# Patient Record
Sex: Male | Born: 1955 | Race: Black or African American | Hispanic: No | Marital: Single | Smoking: Current every day smoker
Health system: Southern US, Community
[De-identification: ages and names within clinical notes are randomized; demographics above are authoritative.]

## PROBLEM LIST (undated history)

## (undated) DIAGNOSIS — M1990 Unspecified osteoarthritis, unspecified site: Secondary | ICD-10-CM

## (undated) DIAGNOSIS — E0590 Thyrotoxicosis, unspecified without thyrotoxic crisis or storm: Secondary | ICD-10-CM

## (undated) DIAGNOSIS — I10 Essential (primary) hypertension: Secondary | ICD-10-CM

## (undated) DIAGNOSIS — T7840XA Allergy, unspecified, initial encounter: Secondary | ICD-10-CM

## (undated) DIAGNOSIS — C801 Malignant (primary) neoplasm, unspecified: Secondary | ICD-10-CM

## (undated) DIAGNOSIS — T8859XA Other complications of anesthesia, initial encounter: Secondary | ICD-10-CM

## (undated) HISTORY — DX: Essential (primary) hypertension: I10

## (undated) HISTORY — PX: WISDOM TOOTH EXTRACTION: SHX21

## (undated) HISTORY — PX: MULTIPLE TOOTH EXTRACTIONS: SHX2053

## (undated) HISTORY — PX: CYST EXCISION: SHX5701

## (undated) HISTORY — DX: Allergy, unspecified, initial encounter: T78.40XA

## (undated) HISTORY — PX: COLONOSCOPY: SHX174

---

## 2013-04-19 ENCOUNTER — Encounter: Payer: Self-pay | Admitting: Internal Medicine

## 2013-04-19 ENCOUNTER — Ambulatory Visit: Payer: Self-pay | Attending: Internal Medicine | Admitting: Internal Medicine

## 2013-04-19 VITALS — BP 149/90 | HR 89 | Temp 98.6°F | Resp 16 | Ht 67.0 in | Wt 137.0 lb

## 2013-04-19 DIAGNOSIS — M25511 Pain in right shoulder: Secondary | ICD-10-CM

## 2013-04-19 LAB — CBC
Hemoglobin: 13.4 g/dL (ref 13.0–17.0)
MCH: 28.6 pg (ref 26.0–34.0)
RBC: 4.68 MIL/uL (ref 4.22–5.81)

## 2013-04-19 MED ORDER — CARVEDILOL 12.5 MG PO TABS: 12.5000 mg | ORAL_TABLET | Freq: Two times a day (BID) | ORAL | Status: DC

## 2013-04-19 MED ORDER — TRAMADOL HCL 50 MG PO TABS: 50.0000 mg | ORAL_TABLET | Freq: Three times a day (TID) | ORAL | Status: DC | PRN

## 2013-04-19 NOTE — Progress Notes (Unsigned)
Pt here to establish care. Hx HTN Ran out of meds yesterday Smoker x 30 yrs. C/o both shoulder pain

## 2013-04-19 NOTE — Progress Notes (Unsigned)
Patient ID: George Austin, male   DOB: [birth year], 57 y.o.   MRN: [id number]  CC:  HPI: 57 year old male is here to establish care. The patient has been on Coreg for hypertension. He can have his medications yesterday. Patient is unemployed and recently found a job and is in the process of getting his insurance. Today he describes no chest pain or shortness of breath upon of consultation the patient does have some irregular heartbeat an EKG is pending. He denies any cardiopulmonary symptoms however he is complaining of pain between shoulder blades He has no other medical problems orthopnea He smokes one pack in 3-4 days He smoked for 30 years  His systolic blood pressure is 149 today  Mother and brother have diabetes  Diet hypertension  No Known Allergies Past Medical History  Diagnosis Date  . Hypertension    No current outpatient prescriptions on file prior to visit.   No current facility-administered medications on file prior to visit.   Family History  Problem Relation Age of Onset  . Diabetes Mother   . Hypertension Father   . Diabetes Brother    History   Social History  . Marital Status: Single    Spouse Name: N/A    Number of Children: N/A  . Years of Education: N/A   Occupational History  . Not on file.   Social History Main Topics  . Smoking status: Current Every Day Smoker -- 0.50 packs/day for 30 years    Types: Cigarettes  . Smokeless tobacco: Not on file  . Alcohol Use: No  . Drug Use: No  . Sexual Activity: Not on file   Other Topics Concern  . Not on file   Social History Narrative  . No narrative on file    Review of Systems  Constitutional: Negative for fever, chills, diaphoresis, activity change, appetite change and fatigue.  HENT: Negative for ear pain, nosebleeds, congestion, facial swelling, rhinorrhea, neck pain, neck stiffness and ear discharge.   Eyes: Negative for pain, discharge, redness, itching and visual disturbance.   Respiratory: Negative for cough, choking, chest tightness, shortness of breath, wheezing and stridor.   Cardiovascular: Negative for chest pain, palpitations and leg swelling.  Gastrointestinal: Negative for abdominal distention.  Genitourinary: Negative for dysuria, urgency, frequency, hematuria, flank pain, decreased urine volume, difficulty urinating and dyspareunia.  Musculoskeletal: Negative for back pain, joint swelling, arthralgias and gait problem.  Neurological: Negative for dizziness, tremors, seizures, syncope, facial asymmetry, speech difficulty, weakness, light-headedness, numbness and headaches.  Hematological: Negative for adenopathy. Does not bruise/bleed easily.  Psychiatric/Behavioral: Negative for hallucinations, behavioral problems, confusion, dysphoric mood, decreased concentration and agitation.    Objective:   Filed Vitals:   04/19/13 1520  BP: 149/90  Pulse: 89  Temp: 98.6 F (37 C)  Resp: 16    Physical Exam  Constitutional: Appears well-developed and well-nourished. No distress.  HENT: Normocephalic. External right and left ear normal. Oropharynx is clear and moist.  Eyes: Conjunctivae and EOM are normal. PERRLA, no scleral icterus.  Neck: Normal ROM. Neck supple. No JVD. No tracheal deviation. No thyromegaly.  CVS: RRR, S1/S2 +, no murmurs, no gallops, no carotid bruit.  Pulmonary: Effort and breath sounds normal, no stridor, rhonchi, wheezes, rales.  Abdominal: Soft. BS +,  no distension, tenderness, rebound or guarding.  Musculoskeletal: Normal range of motion. No edema and no tenderness.  Lymphadenopathy: No lymphadenopathy noted, cervical, inguinal. Neuro: Alert. Normal reflexes, muscle tone coordination. No cranial nerve deficit. Skin: Skin is warm  and dry. No rash noted. Not diaphoretic. No erythema. No pallor.  Psychiatric: Normal mood and affect. Behavior, judgment, thought content normal.   No results found for this basename: WBC, HGB, HCT,  MCV, PLT   No results found for this basename: CREATININE, BUN, NA, K, CL, CO2    No results found for this basename: HGBA1C   Lipid Panel  No results found for this basename: chol, trig, hdl, cholhdl, vldl, ldlcalc       Assessment and plan:   There are no active problems to display for this patient.  Hypertension Recent Coreg Obtain EKG Given pain in his shoulder and between the shoulder blades, will get an EKG, troponin, d-dimer      The patient was given clear instructions to go to ER or return to medical center if symptoms don't improve, worsen or new problems develop. The patient verbalized understanding. The patient was told to call to get any lab results if not heard anything in the next week.

## 2013-04-20 LAB — COMPREHENSIVE METABOLIC PANEL
Albumin: 4.3 g/dL (ref 3.5–5.2)
CO2: 29 mEq/L (ref 19–32)
Chloride: 104 mEq/L (ref 96–112)
Glucose, Bld: 80 mg/dL (ref 70–99)
Potassium: 4.2 mEq/L (ref 3.5–5.3)
Sodium: 140 mEq/L (ref 135–145)
Total Protein: 7.3 g/dL (ref 6.0–8.3)

## 2013-04-20 LAB — TROPONIN I: Troponin I: 0.01 ng/mL (ref <0.06)

## 2013-04-22 ENCOUNTER — Ambulatory Visit (HOSPITAL_COMMUNITY)
Admission: RE | Admit: 2013-04-22 | Discharge: 2013-04-22 | Disposition: A | Discharge status: Home / Self Care | Payer: Self-pay | Source: Ambulatory Visit | Attending: Internal Medicine | Admitting: Internal Medicine

## 2013-04-22 DIAGNOSIS — M25511 Pain in right shoulder: Secondary | ICD-10-CM

## 2013-04-22 DIAGNOSIS — F17200 Nicotine dependence, unspecified, uncomplicated: Secondary | ICD-10-CM | POA: Insufficient documentation

## 2013-04-22 DIAGNOSIS — R9431 Abnormal electrocardiogram [ECG] [EKG]: Secondary | ICD-10-CM | POA: Insufficient documentation

## 2013-04-22 DIAGNOSIS — I10 Essential (primary) hypertension: Secondary | ICD-10-CM | POA: Insufficient documentation

## 2013-04-22 NOTE — Progress Notes (Signed)
Echocardiogram 2D Echocardiogram has been performed.  George Austin 04/22/2013, 10:30 AM

## 2013-04-27 ENCOUNTER — Ambulatory Visit: Payer: Self-pay

## 2013-04-27 LAB — D-DIMER, QUANTITATIVE

## 2013-05-03 ENCOUNTER — Encounter: Payer: Self-pay | Admitting: Internal Medicine

## 2013-05-03 ENCOUNTER — Ambulatory Visit: Payer: Self-pay | Attending: Family Medicine | Admitting: Internal Medicine

## 2013-05-03 VITALS — BP 149/85 | HR 75 | Temp 98.0°F | Resp 16

## 2013-05-03 DIAGNOSIS — R51 Headache: Secondary | ICD-10-CM | POA: Insufficient documentation

## 2013-05-03 DIAGNOSIS — M25512 Pain in left shoulder: Secondary | ICD-10-CM

## 2013-05-03 DIAGNOSIS — N179 Acute kidney failure, unspecified: Secondary | ICD-10-CM

## 2013-05-03 DIAGNOSIS — I10 Essential (primary) hypertension: Secondary | ICD-10-CM

## 2013-05-03 DIAGNOSIS — M25519 Pain in unspecified shoulder: Secondary | ICD-10-CM | POA: Insufficient documentation

## 2013-05-03 DIAGNOSIS — M25511 Pain in right shoulder: Secondary | ICD-10-CM

## 2013-05-03 DIAGNOSIS — Z09 Encounter for follow-up examination after completed treatment for conditions other than malignant neoplasm: Secondary | ICD-10-CM | POA: Insufficient documentation

## 2013-05-03 DIAGNOSIS — F17200 Nicotine dependence, unspecified, uncomplicated: Secondary | ICD-10-CM

## 2013-05-03 MED ORDER — ACETAMINOPHEN 500 MG PO TABS: 1000.0000 mg | ORAL_TABLET | Freq: Three times a day (TID) | ORAL | Status: DC | PRN

## 2013-05-03 MED ORDER — CARVEDILOL 12.5 MG PO TABS: 6.2500 mg | ORAL_TABLET | Freq: Two times a day (BID) | ORAL | Status: DC

## 2013-05-03 MED ORDER — AMLODIPINE BESYLATE 5 MG PO TABS: 5.0000 mg | ORAL_TABLET | Freq: Every day | ORAL | Status: DC

## 2013-05-03 MED ORDER — ACETAMINOPHEN 500 MG PO TABS: 500.0000 mg | ORAL_TABLET | Freq: Four times a day (QID) | ORAL | Status: DC | PRN

## 2013-05-03 NOTE — Progress Notes (Signed)
PT HERE FOR LAB RESULTS. C/O SLIGHT R SHOULDER PAIN. TAKES TRAMADOL PRN

## 2013-05-03 NOTE — Progress Notes (Signed)
Patient ID: George Austin, male   DOB: Jul 27, 1956, 57 y.o.   MRN: [id number] PCP:  Jeanann Lewandowsky, MD   Chief Complaint:  Follow up  HPI: 57 year old male who was recently seen in the clinic to establish care for hypertension and right shoulder pain here for followup. Patient reports that after being placed on coreg  12.5 mg twice daily he has experienced headache and he has reduced the dose to once a day and feels fine. He also reports ongoing pain over the right shoulder for past several weeks. He reports having immediately to the right shoulder about 2 years back. He has been taking Aleve 1 to 2 tablets daily for the pain. Recent blood work showed mild acute kidney injury  with creatinine 1.4. He also had a 2-D echo done for?? Which Was normal.  Allergies: No Known Allergies  Prior to Admission medications   Medication Sig Start Date End Date Taking? Authorizing Provider  carvedilol (COREG) 12.5 MG tablet Take 0.5 tablets (6.25 mg total) by mouth 2 (two) times daily with a meal. 05/03/13  Yes Shamal Stracener, MD  acetaminophen (TYLENOL) 500 MG tablet Take 2 tablets (1,000 mg total) by mouth every 8 (eight) hours as needed for pain. 05/03/13   Felder Lebeda, MD  amLODipine (NORVASC) 5 MG tablet Take 1 tablet (5 mg total) by mouth daily. 05/03/13   Eddie North, MD    Past Medical History  Diagnosis Date  . Hypertension     History reviewed. No pertinent past surgical history.  Social History:  reports that he has been smoking Cigarettes.  He has a 15 pack-year smoking history. He does not have any smokeless tobacco history on file. He reports that he does not drink alcohol or use illicit drugs.  Family History  Problem Relation Age of Onset  . Diabetes Mother   . Hypertension Father   . Diabetes Brother     Review of Systems:  As outlined in history of present illness  Physical Exam:  Filed Vitals:   05/03/13 1622  BP: 149/85  Pulse: 75  Temp: 98 F (36.7 C)   TempSrc: Oral  Resp: 16  SpO2: 98%    Constitutional: Vital signs reviewed.  Patient is a thin built male in no acute distress HEENT: No pallor, moist oral mucosa Chest: Clear to auscultation bilaterally, no added sounds CVS: Normal S1 and S2, no murmurs rub or gallop Abdomen: Soft, nontender, nondistended, bowel sounds present Extremities: Painful range of motion above 90 of right shoulder. Mild Tenderness to palpation over right anterior shoulder joint on movement. No deformity or swelling noted. In his: AAO x3  Labs on Admission:  No results found for this or any previous visit (from the past 48 hour(s)).  Radiological Exams on Admission: No results found.  Assessment/Plan Uncontrolled hypertension I will reduce his Coreg dose to 6.25 mg twice a day given symptoms of headache with higher dose. I will add low-dose amlodipine 5 mg daily. Patient instructed on a reducing salt intake in diet and regular exercise. Counseled on smoking cessation.  Right shoulder pain possible rotator cuff injury i will obtain an  x-ray of the right shoulder joint. Patient instructed to avoid NSAIDs. that this him on extra strength Tylenol when necessary.  Tobacco use Counseled on smoking cessation  Acute kidney injury Possibly in the setting of NSAIDs use. Instructed to avoid NSAIDs and followup in 2 weeks to have repeat BMET   Followup in 2 weeks for blood pressure monitoring  and repeat BMET.  Lexis Potenza 05/03/2013, 4:52 PM

## 2013-05-12 ENCOUNTER — Ambulatory Visit (HOSPITAL_COMMUNITY)
Admission: RE | Admit: 2013-05-12 | Discharge: 2013-05-12 | Disposition: A | Discharge status: Home / Self Care | Payer: Self-pay | Source: Ambulatory Visit | Attending: Internal Medicine | Admitting: Internal Medicine

## 2013-05-12 DIAGNOSIS — M25519 Pain in unspecified shoulder: Secondary | ICD-10-CM | POA: Insufficient documentation

## 2013-05-12 DIAGNOSIS — M25511 Pain in right shoulder: Secondary | ICD-10-CM

## 2013-05-12 NOTE — Addendum Note (Signed)
Addended by: Lestine Mount on: 05/12/2013 02:25 PM   Modules accepted: Orders

## 2013-05-13 ENCOUNTER — Ambulatory Visit: Payer: Self-pay | Attending: Family Medicine | Admitting: Internal Medicine

## 2013-05-13 VITALS — BP 135/84 | HR 76 | Temp 97.2°F | Resp 16

## 2013-05-13 DIAGNOSIS — E785 Hyperlipidemia, unspecified: Secondary | ICD-10-CM | POA: Insufficient documentation

## 2013-05-13 DIAGNOSIS — M25519 Pain in unspecified shoulder: Secondary | ICD-10-CM

## 2013-05-13 DIAGNOSIS — F17200 Nicotine dependence, unspecified, uncomplicated: Secondary | ICD-10-CM

## 2013-05-13 DIAGNOSIS — M25511 Pain in right shoulder: Secondary | ICD-10-CM

## 2013-05-13 DIAGNOSIS — I10 Essential (primary) hypertension: Secondary | ICD-10-CM

## 2013-05-13 DIAGNOSIS — N179 Acute kidney failure, unspecified: Secondary | ICD-10-CM

## 2013-05-13 MED ORDER — NICOTINE 14 MG/24HR TD PT24: 1.0000 | MEDICATED_PATCH | TRANSDERMAL | Status: DC

## 2013-05-13 MED ORDER — TRAMADOL HCL 50 MG PO TABS: 50.0000 mg | ORAL_TABLET | Freq: Three times a day (TID) | ORAL | Status: DC | PRN

## 2013-05-13 NOTE — Progress Notes (Signed)
Patient here for results from yesterday Had xray of shoulder done

## 2013-05-13 NOTE — Progress Notes (Signed)
Patient ID: George Austin, male   DOB: [birth year], 57 y.o.   MRN: [id number] Patient Demographics  George Austin, is a 57 y.o. male  [id number]  [id number]  DOB - 1955-10-05  Chief Complaint  Patient presents with  . Results        Subjective:   George Austin today is here for a follow up visit. Patient has No headache, No chest pain, No abdominal pain - No Nausea, No new weakness tingling or numbness, No Cough - SOB.  Is here for his x-ray results of the right shoulder  Objective:    Filed Vitals:   05/13/13 1411 05/13/13 1424  BP: 122/78 135/84  Pulse: 76   Temp: 97.2 F (36.2 C)   Resp: 16   SpO2: 100%      ALLERGIES:  No Known Allergies  PAST MEDICAL HISTORY: Past Medical History  Diagnosis Date  . Hypertension     MEDICATIONS AT HOME: Prior to Admission medications   Medication Sig Start Date End Date Taking? Authorizing Provider  acetaminophen (TYLENOL) 500 MG tablet Take 2 tablets (1,000 mg total) by mouth every 8 (eight) hours as needed for pain. 05/03/13   Nishant Dhungel, MD  amLODipine (NORVASC) 5 MG tablet Take 1 tablet (5 mg total) by mouth daily. 05/03/13   Nishant Dhungel, MD  carvedilol (COREG) 12.5 MG tablet Take 0.5 tablets (6.25 mg total) by mouth 2 (two) times daily with a meal. 05/03/13   Nishant Dhungel, MD  traMADol (ULTRAM) 50 MG tablet Take 1 tablet (50 mg total) by mouth every 8 (eight) hours as needed for pain. 05/13/13   Ripudeep Jenna Luo, MD     Exam  General appearance :Awake, alert, NAD, Speech Clear.  HEENT: Atraumatic and Normocephalic, PERLA Neck: supple, no JVD. No cervical lymphadenopathy.  Chest: Clear to auscultation bilaterally, no wheezing, rales or rhonchi CVS: S1 S2 regular, no murmurs.  Abdomen: soft, NBS, NT, ND, no gaurding, rigidity or rebound. Extremities: no cyanosis or clubbing, B/L Lower Ext shows no edema Neurology: Awake alert, and oriented X 3, CN II-XII intact, Non focal Skin: No Rash or  lesions Wounds:N/A    Data Review   Basic Metabolic Panel: No results found for this basename: NA, K, CL, CO2, GLUCOSE, BUN, CREATININE, CALCIUM, MG, PHOS,  in the last 168 hours Liver Function Tests: No results found for this basename: AST, ALT, ALKPHOS, BILITOT, PROT, ALBUMIN,  in the last 168 hours  CBC: No results found for this basename: WBC, NEUTROABS, HGB, HCT, MCV, PLT,  in the last 168 hours  ------------------------------------------------------------------------------------------------------------------ No results found for this basename: HGBA1C,  in the last 72 hours ------------------------------------------------------------------------------------------------------------------ No results found for this basename: CHOL, HDL, LDLCALC, TRIG, CHOLHDL, LDLDIRECT,  in the last 72 hours ------------------------------------------------------------------------------------------------------------------ No results found for this basename: TSH, T4TOTAL, FREET3, T3FREE, THYROIDAB,  in the last 72 hours ------------------------------------------------------------------------------------------------------------------ No results found for this basename: VITAMINB12, FOLATE, FERRITIN, TIBC, IRON, RETICCTPCT,  in the last 72 hours  Coagulation profile  No results found for this basename: INR, PROTIME,  in the last 168 hours    Assessment & Plan   Active Problems: Hypertension: BP stable - He has amlodipine and coreg with refills  Hyperlipidemia - Patient did not want any statins, wants to repeat labs at next visit.  - he will try healthy diet  Nicotine use: 1/2 pack in 2 days, motivated to quit smoking - will give prescription for nicotine patch  Right shoulder discomfort: - xray negative, will  give prescription for tramadol - does not want MRI to r/o rotator cuff tear  AKI: recheck BMET  Health screening: declined flu shot  Follow-up in 3 months    RAI,RIPUDEEP  M.D. 05/13/2013, 2:26 PM

## 2013-05-14 LAB — BASIC METABOLIC PANEL
CO2: 25 mEq/L (ref 19–32)
Chloride: 101 mEq/L (ref 96–112)
Glucose, Bld: 94 mg/dL (ref 70–99)
Potassium: 4.1 mEq/L (ref 3.5–5.3)
Sodium: 136 mEq/L (ref 135–145)

## 2013-08-12 ENCOUNTER — Ambulatory Visit: Payer: Self-pay | Attending: Internal Medicine | Admitting: Internal Medicine

## 2013-08-12 ENCOUNTER — Encounter: Payer: Self-pay | Admitting: Internal Medicine

## 2013-08-12 ENCOUNTER — Encounter (INDEPENDENT_AMBULATORY_CARE_PROVIDER_SITE_OTHER): Payer: Self-pay

## 2013-08-12 VITALS — BP 139/89 | HR 87 | Temp 98.9°F | Resp 14 | Ht 67.0 in | Wt 124.2 lb

## 2013-08-12 DIAGNOSIS — I10 Essential (primary) hypertension: Secondary | ICD-10-CM

## 2013-08-12 DIAGNOSIS — L851 Acquired keratosis [keratoderma] palmaris et plantaris: Secondary | ICD-10-CM | POA: Insufficient documentation

## 2013-08-12 LAB — CBC WITH DIFFERENTIAL/PLATELET
Basophils Absolute: 0 10*3/uL (ref 0.0–0.1)
Basophils Relative: 0 % (ref 0–1)
Eosinophils Relative: 3 % (ref 0–5)
HCT: 42.4 % (ref 39.0–52.0)
Hemoglobin: 14.9 g/dL (ref 13.0–17.0)
MCH: 30.8 pg (ref 26.0–34.0)
MCHC: 35.1 g/dL (ref 30.0–36.0)
MCV: 87.8 fL (ref 78.0–100.0)
Monocytes Absolute: 0.8 10*3/uL (ref 0.1–1.0)
Monocytes Relative: 10 % (ref 3–12)
RDW: 17.8 % — ABNORMAL HIGH (ref 11.5–15.5)

## 2013-08-12 LAB — COMPLETE METABOLIC PANEL WITH GFR
AST: 39 U/L — ABNORMAL HIGH (ref 0–37)
Albumin: 4.8 g/dL (ref 3.5–5.2)
Alkaline Phosphatase: 108 U/L (ref 39–117)
BUN: 13 mg/dL (ref 6–23)
Calcium: 9.8 mg/dL (ref 8.4–10.5)
Creat: 1.08 mg/dL (ref 0.50–1.35)
GFR, Est Non African American: 77 mL/min
Glucose, Bld: 84 mg/dL (ref 70–99)

## 2013-08-12 LAB — LIPID PANEL
Cholesterol: 219 mg/dL — ABNORMAL HIGH (ref 0–200)
Total CHOL/HDL Ratio: 2.4 Ratio
Triglycerides: 80 mg/dL (ref <150)
VLDL: 16 mg/dL (ref 0–40)

## 2013-08-12 MED ORDER — CARVEDILOL 12.5 MG PO TABS: 6.2500 mg | ORAL_TABLET | Freq: Two times a day (BID) | ORAL | Status: DC

## 2013-08-12 MED ORDER — TRAMADOL HCL 50 MG PO TABS: 50.0000 mg | ORAL_TABLET | Freq: Three times a day (TID) | ORAL | Status: DC | PRN

## 2013-08-12 MED ORDER — AMLODIPINE BESYLATE 5 MG PO TABS: 5.0000 mg | ORAL_TABLET | Freq: Every day | ORAL | Status: DC

## 2013-08-12 MED ORDER — ACETAMINOPHEN 500 MG PO TABS: 1000.0000 mg | ORAL_TABLET | Freq: Three times a day (TID) | ORAL | Status: DC | PRN

## 2013-08-12 MED ORDER — NICOTINE 14 MG/24HR TD PT24: 14.0000 mg | MEDICATED_PATCH | TRANSDERMAL | Status: DC

## 2013-08-12 NOTE — Progress Notes (Signed)
Pt is here for a check up.  Complains of spot at the bottom of Lt shoulder on the back x3 weeks. No pain. Abdominal pain that fluctuates; no nausea or vomiting.

## 2013-08-12 NOTE — Progress Notes (Signed)
Patient ID: George Austin, male   DOB: Apr 30, 1956, 57 y.o.   MRN: [id number]   CC:  HPI: 57 year old male here for a 3 monthly followup. He states that he has a mole below his left shoulder that he has noticed for about 3 weeks. The lesion is itchy. He does not not it is increasing in size. He has not used anything topically  He states that shoulder pain is much improved His blood pressures also controlled    No Known Allergies Past Medical History  Diagnosis Date  . Hypertension    No current outpatient prescriptions on file prior to visit.   No current facility-administered medications on file prior to visit.   Family History  Problem Relation Age of Onset  . Diabetes Mother   . Hypertension Father   . Diabetes Brother    History   Social History  . Marital Status: Single    Spouse Name: N/A    Number of Children: N/A  . Years of Education: N/A   Occupational History  . Not on file.   Social History Main Topics  . Smoking status: Current Every Day Smoker -- 0.50 packs/day for 30 years    Types: Cigarettes  . Smokeless tobacco: Not on file  . Alcohol Use: Yes  . Drug Use: No  . Sexual Activity: Not on file   Other Topics Concern  . Not on file   Social History Narrative  . No narrative on file    Review of Systems  Constitutional: Negative for fever, chills, diaphoresis, activity change, appetite change and fatigue.  HENT: Negative for ear pain, nosebleeds, congestion, facial swelling, rhinorrhea, neck pain, neck stiffness and ear discharge.   Eyes: Negative for pain, discharge, redness, itching and visual disturbance.  Respiratory: Negative for cough, choking, chest tightness, shortness of breath, wheezing and stridor.   Cardiovascular: Negative for chest pain, palpitations and leg swelling.  Gastrointestinal: Negative for abdominal distention.  Genitourinary: Negative for dysuria, urgency, frequency, hematuria, flank pain, decreased urine volume,  difficulty urinating and dyspareunia.  Musculoskeletal: Negative for back pain, joint swelling, arthralgias and gait problem.  Neurological: Negative for dizziness, tremors, seizures, syncope, facial asymmetry, speech difficulty, weakness, light-headedness, numbness and headaches.  Hematological: Negative for adenopathy. Does not bruise/bleed easily.  Psychiatric/Behavioral: Negative for hallucinations, behavioral problems, confusion, dysphoric mood, decreased concentration and agitation.    Objective:   Filed Vitals:   08/12/13 1428  BP: 139/89  Pulse: 87  Temp: 98.9 F (37.2 C)  Resp: 14    Physical Exam  Constitutional: Appears well-developed and well-nourished. No distress.  HENT: Normocephalic. External right and left ear normal. Oropharynx is clear and moist.  Eyes: Conjunctivae and EOM are normal. PERRLA, no scleral icterus.  Neck: Normal ROM. Neck supple. No JVD. No tracheal deviation. No thyromegaly.  CVS: RRR, S1/S2 +, no murmurs, no gallops, no carotid bruit.  Pulmonary: Effort and breath sounds normal, no stridor, rhonchi, wheezes, rales.  Abdominal: Soft. BS +,  no distension, tenderness, rebound or guarding.  Musculoskeletal: Normal range of motion. No edema and no tenderness.  Lymphadenopathy: No lymphadenopathy noted, cervical, inguinal. Neuro: Alert. Normal reflexes, muscle tone coordination. No cranial nerve deficit. Skin: 1 cm by half a centimeter lesion below the left shoulder and appears to be a hyperkeratotic skin lesion with surrounding erythema and hyperpigmentation  .  Psychiatric: Normal mood and affect. Behavior, judgment, thought content normal.   Lab Results  Component Value Date   WBC 8.4 04/19/2013  HGB 13.4 04/19/2013   HCT 40.3 04/19/2013   MCV 86.1 04/19/2013   PLT 333 04/19/2013   Lab Results  Component Value Date   CREATININE 1.16 05/13/2013   BUN 15 05/13/2013   NA 136 05/13/2013   K 4.1 05/13/2013   CL 101 05/13/2013   CO2 25 05/13/2013     No results found for this basename: HGBA1C   Lipid Panel     Component Value Date/Time   CHOL 215* 04/19/2013 1531   TRIG 136 04/19/2013 1531   HDL 42 04/19/2013 1531   CHOLHDL 5.1 04/19/2013 1531   VLDL 27 04/19/2013 1531   LDLCALC 146* 04/19/2013 1531       Assessment and plan:   Patient Active Problem List   Diagnosis Date Noted  . Unspecified essential hypertension 05/03/2013  . Acute kidney injury 05/03/2013  . Right shoulder pain 05/03/2013  . Tobacco use disorder 05/03/2013   Hyperkeratotic left shoulder lesion Will refer to dermatology for a skin biopsy Apply hydrocortisone ointment for itching  Right shoulder discomfort resolved     Hypertension Medications refilled, continue Coreg and Norvasc at the current dose  The patient was given clear instructions to go to ER or return to medical center if symptoms don't improve, worsen or new problems develop. The patient verbalized understanding. The patient was told to call to get any lab results if not heard anything in the next week.

## 2013-08-25 ENCOUNTER — Telehealth: Payer: Self-pay | Admitting: *Deleted

## 2013-08-25 NOTE — Telephone Encounter (Signed)
Message copied by Carmilla Granville, [place] R on Wed Aug 25, 2013 10:41 AM ------      Message from: Susie Cassette MD, [facility]      Created: Tue Aug 24, 2013  2:27 PM       Please notify patient and her labs are normal ------

## 2013-09-22 ENCOUNTER — Other Ambulatory Visit: Payer: Self-pay | Admitting: Internal Medicine

## 2013-11-10 ENCOUNTER — Ambulatory Visit: Payer: Self-pay | Admitting: Internal Medicine

## 2014-01-12 ENCOUNTER — Other Ambulatory Visit: Payer: Self-pay | Admitting: Internal Medicine

## 2014-01-12 DIAGNOSIS — I10 Essential (primary) hypertension: Secondary | ICD-10-CM

## 2014-01-14 ENCOUNTER — Other Ambulatory Visit: Payer: Self-pay | Admitting: Internal Medicine

## 2014-10-03 ENCOUNTER — Other Ambulatory Visit: Payer: Self-pay | Admitting: Internal Medicine

## 2014-10-04 ENCOUNTER — Other Ambulatory Visit: Payer: Self-pay | Admitting: Internal Medicine

## 2014-10-05 NOTE — Telephone Encounter (Signed)
Patient needs to schedule an appointment.  Unable to authorize additional refills.

## 2014-12-08 ENCOUNTER — Telehealth: Payer: Self-pay | Admitting: Internal Medicine

## 2014-12-09 NOTE — Telephone Encounter (Signed)
Patient called to request a med refill for amLODipine (NORVASC) 5 MG tablet, he is currently working on finding a new doctor since he now has insurance but needs enough medication to last him until then. Please f/u with pt.

## 2014-12-12 ENCOUNTER — Telehealth: Payer: Self-pay | Admitting: *Deleted

## 2014-12-12 NOTE — Telephone Encounter (Signed)
Patient has not been seen at clinic since 2014 and called in to say he is looking for a new MD but wants us to refill his medication in the meantime.  Explained to patient that we were unable to refill his medications

## 2014-12-14 NOTE — Telephone Encounter (Signed)
Patient came into clinic requesting medication refill for amLODipine (NORVASC) 5 MG tablet. Pt is scheduled for 12/21/14 , could we supply patient with 10 day supply. Please f/u

## 2014-12-21 ENCOUNTER — Encounter: Payer: Self-pay | Admitting: Internal Medicine

## 2014-12-21 ENCOUNTER — Ambulatory Visit: Payer: No Typology Code available for payment source | Attending: Internal Medicine | Admitting: Internal Medicine

## 2014-12-21 VITALS — BP 110/75 | HR 94 | Temp 98.4°F | Resp 16 | Ht 67.0 in | Wt 106.0 lb

## 2014-12-21 DIAGNOSIS — I10 Essential (primary) hypertension: Secondary | ICD-10-CM | POA: Insufficient documentation

## 2014-12-21 DIAGNOSIS — F17200 Nicotine dependence, unspecified, uncomplicated: Secondary | ICD-10-CM

## 2014-12-21 DIAGNOSIS — Z720 Tobacco use: Secondary | ICD-10-CM | POA: Insufficient documentation

## 2014-12-21 NOTE — Progress Notes (Signed)
Pt is here following up on his HTN. Pt states that he has been out of his medications for about 10 days.

## 2014-12-21 NOTE — Progress Notes (Signed)
Patient ID: George AsalGeorge E Papania, male   DOB: 10/11/1955, 59 y.o.   MRN: [id number] Subjective:  George Austin is a 59 y.o. male with hypertension.  He reports that he has been taking coreg and amlodipine for the past 1.5 years. He states that he has been out of amlodipine for the past 10 days. Today his pressures are normal.   Current Outpatient Prescriptions  Medication Sig Dispense Refill  . amLODipine (NORVASC) 5 MG tablet TAKE 1 TABLET BY MOUTH ONCE DAILY 30 tablet 0  . carvedilol (COREG) 12.5 MG tablet TAKE 1 TABLET BY MOUTH TWICE DAILY WITH A MEAL 60 tablet 3  . acetaminophen (TYLENOL) 500 MG tablet Take 2 tablets (1,000 mg total) by mouth every 8 (eight) hours as needed. (Patient not taking: Reported on 12/21/2014) 30 tablet 0  . nicotine (EQL NICOTINE) 14 mg/24hr patch Place 1 patch (14 mg total) onto the skin daily. (Patient not taking: Reported on 12/21/2014) 28 patch 2  . traMADol (ULTRAM) 50 MG tablet Take 1 tablet (50 mg total) by mouth every 8 (eight) hours as needed. (Patient not taking: Reported on 12/21/2014) 60 tablet 2   No current facility-administered medications for this visit.    Hypertension ROS: taking medications as instructed, no medication side effects noted, no TIA's, no chest pain on exertion, no dyspnea on exertion, no swelling of ankles and no palpitations.  New concerns: He would like to come off at least one of his medication. He smokes 1 pack every 3-4 days.   Objective:  BP 110/75 mmHg  Pulse 94  Temp(Src) 98.4 F (36.9 C) (Oral)  Resp 16  Ht 5\' 7"  (1.702 m)  Wt 106 lb (48.081 kg)  BMI 16.60 kg/m2  SpO2 98%  Appearance alert, well appearing, and in no distress and oriented to person, place, and time. General exam BP noted to be well controlled today in office, S1, S2 normal, no gallop, no murmur, chest clear, no JVD, no HSM, no edema, CVS exam  - normal rate, regular rhythm, normal S1, S2, no murmurs, rubs, clicks or gallops, normal bilateral carotid upstroke  without bruits, no JVD.  Lab review: orders written for new lab studies as appropriate; see orders.   Assessment:   Hypertension well controlled and needs to quit smoking.   Plan:  Orders and follow up as documented in patient record. Recommended sodium restriction. Very strongly urged to quit smoking to reduce cardiovascular risk. The following changes are to be made: May stop amlodipine for now until next BP check.  > 50% of time spent reviewing patients chart and getting medical history since this is my initial visit with patient  Return in about 4 weeks (around 01/18/2015) for Nurse Visit-BP check and Lab Visit and 6 mo PCP HTN. If his BP is still normal while off amlodipine he may continue with coreg BID only. If elevated at that time he will need to restart amlodipine   George Austin, VALERIE, NP 12/25/2014 9:38 PM

## 2014-12-21 NOTE — Patient Instructions (Signed)
Smoking Cessation Quitting smoking is important to your health and has many advantages. However, it is not always easy to quit since nicotine is a very addictive drug. Oftentimes, people try 3 times or more before being able to quit. This document explains the best ways for you to prepare to quit smoking. Quitting takes hard work and a lot of effort, but you can do it. ADVANTAGES OF QUITTING SMOKING  You will live longer, feel better, and live better.  Your body will feel the impact of quitting smoking almost immediately.  Within 20 minutes, blood pressure decreases. Your pulse returns to its normal level.  After 8 hours, carbon monoxide levels in the blood return to normal. Your oxygen level increases.  After 24 hours, the chance of having a heart attack starts to decrease. Your breath, hair, and body stop smelling like smoke.  After 48 hours, damaged nerve endings begin to recover. Your sense of taste and smell improve.  After 72 hours, the body is virtually free of nicotine. Your bronchial tubes relax and breathing becomes easier.  After 2 to 12 weeks, lungs can hold more air. Exercise becomes easier and circulation improves.  The risk of having a heart attack, stroke, cancer, or lung disease is greatly reduced.  After 1 year, the risk of coronary heart disease is cut in half.  After 5 years, the risk of stroke falls to the same as a nonsmoker.  After 10 years, the risk of lung cancer is cut in half and the risk of other cancers decreases significantly.  After 15 years, the risk of coronary heart disease drops, usually to the level of a nonsmoker.  If you are pregnant, quitting smoking will improve your chances of having a healthy baby.  The people you live with, especially any children, will be healthier.  You will have extra money to spend on things other than cigarettes. QUESTIONS TO THINK ABOUT BEFORE ATTEMPTING TO QUIT You may want to talk about your answers with your  health care provider.  Why do you want to quit?  If you tried to quit in the past, what helped and what did not?  What will be the most difficult situations for you after you quit? How will you plan to handle them?  Who can help you through the tough times? Your family? Friends? A health care provider?  What pleasures do you get from smoking? What ways can you still get pleasure if you quit? Here are some questions to ask your health care provider:  How can you help me to be successful at quitting?  What medicine do you think would be best for me and how should I take it?  What should I do if I need more help?  What is smoking withdrawal like? How can I get information on withdrawal? GET READY  Set a quit date.  Change your environment by getting rid of all cigarettes, ashtrays, matches, and lighters in your home, car, or work. Do not let people smoke in your home.  Review your past attempts to quit. Think about what worked and what did not. GET SUPPORT AND ENCOURAGEMENT You have a better chance of being successful if you have help. You can get support in many ways.  Tell your family, friends, and coworkers that you are going to quit and need their support. Ask them not to smoke around you.  Get individual, group, or telephone counseling and support. Programs are available at local hospitals and health centers. Call   your local health department for information about programs in your area.  Spiritual beliefs and practices may help some smokers quit.  Download a "quit meter" on your computer to keep track of quit statistics, such as how long you have gone without smoking, cigarettes not smoked, and money saved.  Get a self-help book about quitting smoking and staying off tobacco. LEARN NEW SKILLS AND BEHAVIORS  Distract yourself from urges to smoke. Talk to someone, go for a walk, or occupy your time with a task.  Change your normal routine. Take a different route to work.  Drink tea instead of coffee. Eat breakfast in a different place.  Reduce your stress. Take a hot bath, exercise, or read a book.  Plan something enjoyable to do every day. Reward yourself for not smoking.  Explore interactive web-based programs that specialize in helping you quit. GET MEDICINE AND USE IT CORRECTLY Medicines can help you stop smoking and decrease the urge to smoke. Combining medicine with the above behavioral methods and support can greatly increase your chances of successfully quitting smoking.  Nicotine replacement therapy helps deliver nicotine to your body without the negative effects and risks of smoking. Nicotine replacement therapy includes nicotine gum, lozenges, inhalers, nasal sprays, and skin patches. Some may be available over-the-counter and others require a prescription.  Antidepressant medicine helps people abstain from smoking, but how this works is unknown. This medicine is available by prescription.  Nicotinic receptor partial agonist medicine simulates the effect of nicotine in your brain. This medicine is available by prescription. Ask your health care provider for advice about which medicines to use and how to use them based on your health history. Your health care provider will tell you what side effects to look out for if you choose to be on a medicine or therapy. Carefully read the information on the package. Do not use any other product containing nicotine while using a nicotine replacement product.  RELAPSE OR DIFFICULT SITUATIONS Most relapses occur within the first 3 months after quitting. Do not be discouraged if you start smoking again. Remember, most people try several times before finally quitting. You may have symptoms of withdrawal because your body is used to nicotine. You may crave cigarettes, be irritable, feel very hungry, cough often, get headaches, or have difficulty concentrating. The withdrawal symptoms are only temporary. They are strongest  when you first quit, but they will go away within 10-14 days. To reduce the chances of relapse, try to:  Avoid drinking alcohol. Drinking lowers your chances of successfully quitting.  Reduce the amount of caffeine you consume. Once you quit smoking, the amount of caffeine in your body increases and can give you symptoms, such as a rapid heartbeat, sweating, and anxiety.  Avoid smokers because they can make you want to smoke.  Do not let weight gain distract you. Many smokers will gain weight when they quit, usually less than 10 pounds. Eat a healthy diet and stay active. You can always lose the weight gained after you quit.  Find ways to improve your mood other than smoking. FOR MORE INFORMATION  www.smokefree.gov  Document Released: 08/06/2001 Document Revised: 12/27/2013 Document Reviewed: 11/21/2011 [employer] Patient Information 2015 [employer]. This information is not intended to replace advice given to you by your health care provider. Make sure you discuss any questions you have with your health care provider. DASH Eating Plan DASH stands for "Dietary Approaches to Stop Hypertension." The DASH eating plan is a healthy eating plan that has   been shown to reduce high blood pressure (hypertension). Additional health benefits may include reducing the risk of type 2 diabetes mellitus, heart disease, and stroke. The DASH eating plan may also help with weight loss. WHAT DO I NEED TO KNOW ABOUT THE DASH EATING PLAN? For the DASH eating plan, you will follow these general guidelines:  Choose foods with a percent daily value for sodium of less than 5% (as listed on the food label).  Use salt-free seasonings or herbs instead of table salt or sea salt.  Check with your health care provider or pharmacist before using salt substitutes.  Eat lower-sodium products, often labeled as "lower sodium" or "no salt added."  Eat fresh foods.  Eat more vegetables, fruits, and low-fat dairy  products.  Choose whole grains. Look for the word "whole" as the first word in the ingredient list.  Choose fish and skinless chicken or turkey more often than red meat. Limit fish, poultry, and meat to 6 oz (170 g) each day.  Limit sweets, desserts, sugars, and sugary drinks.  Choose heart-healthy fats.  Limit cheese to 1 oz (28 g) per day.  Eat more home-cooked food and less restaurant, buffet, and fast food.  Limit fried foods.  Cook foods using methods other than frying.  Limit canned vegetables. If you do use them, rinse them well to decrease the sodium.  When eating at a restaurant, ask that your food be prepared with less salt, or no salt if possible. WHAT FOODS CAN I EAT? Seek help from a dietitian for individual calorie needs. Grains Whole grain or whole wheat bread. Brown rice. Whole grain or whole wheat pasta. Quinoa, bulgur, and whole grain cereals. Low-sodium cereals. Corn or whole wheat flour tortillas. Whole grain cornbread. Whole grain crackers. Low-sodium crackers. Vegetables Fresh or frozen vegetables (raw, steamed, roasted, or grilled). Low-sodium or reduced-sodium tomato and vegetable juices. Low-sodium or reduced-sodium tomato sauce and paste. Low-sodium or reduced-sodium canned vegetables.  Fruits All fresh, canned (in natural juice), or frozen fruits. Meat and Other Protein Products Ground beef (85% or leaner), grass-fed beef, or beef trimmed of fat. Skinless chicken or turkey. Ground chicken or turkey. Pork trimmed of fat. All fish and seafood. Eggs. Dried beans, peas, or lentils. Unsalted nuts and seeds. Unsalted canned beans. Dairy Low-fat dairy products, such as skim or 1% milk, 2% or reduced-fat cheeses, low-fat ricotta or cottage cheese, or plain low-fat yogurt. Low-sodium or reduced-sodium cheeses. Fats and Oils Tub margarines without trans fats. Light or reduced-fat mayonnaise and salad dressings (reduced sodium). Avocado. Safflower, olive, or canola  oils. Natural peanut or almond butter. Other Unsalted popcorn and pretzels. The items listed above may not be a complete list of recommended foods or beverages. Contact your dietitian for more options. WHAT FOODS ARE NOT RECOMMENDED? Grains White bread. White pasta. White rice. Refined cornbread. Bagels and croissants. Crackers that contain trans fat. Vegetables Creamed or fried vegetables. Vegetables in a cheese sauce. Regular canned vegetables. Regular canned tomato sauce and paste. Regular tomato and vegetable juices. Fruits Dried fruits. Canned fruit in light or heavy syrup. Fruit juice. Meat and Other Protein Products Fatty cuts of meat. Ribs, chicken wings, bacon, sausage, bologna, salami, chitterlings, fatback, hot dogs, bratwurst, and packaged luncheon meats. Salted nuts and seeds. Canned beans with salt. Dairy Whole or 2% milk, cream, half-and-half, and cream cheese. Whole-fat or sweetened yogurt. Full-fat cheeses or blue cheese. Nondairy creamers and whipped toppings. Processed cheese, cheese spreads, or cheese curds. Condiments Onion and garlic salt,   seasoned salt, table salt, and sea salt. Canned and packaged gravies. Worcestershire sauce. Tartar sauce. Barbecue sauce. Teriyaki sauce. Soy sauce, including reduced sodium. Steak sauce. Fish sauce. Oyster sauce. Cocktail sauce. Horseradish. Ketchup and mustard. Meat flavorings and tenderizers. Bouillon cubes. Hot sauce. Tabasco sauce. Marinades. Taco seasonings. Relishes. Fats and Oils Butter, stick margarine, lard, shortening, ghee, and bacon fat. Coconut, palm kernel, or palm oils. Regular salad dressings. Other Pickles and olives. Salted popcorn and pretzels. The items listed above may not be a complete list of foods and beverages to avoid. Contact your dietitian for more information. WHERE CAN I FIND MORE INFORMATION? [employer], [employer]: www.nhlbi.nih.gov/health/health-topics/topics/dash/ Document Released:  08/01/2011 Document Revised: 12/27/2013 Document Reviewed: 06/16/2013 [employer] Patient Information 2015 [employer]. This information is not intended to replace advice given to you by your health care provider. Make sure you discuss any questions you have with your health care provider.  

## 2014-12-25 MED ORDER — CARVEDILOL 12.5 MG PO TABS: ORAL_TABLET | ORAL | Status: DC

## 2015-01-18 ENCOUNTER — Ambulatory Visit: Payer: No Typology Code available for payment source | Attending: Internal Medicine | Admitting: *Deleted

## 2015-01-18 ENCOUNTER — Ambulatory Visit: Payer: No Typology Code available for payment source

## 2015-01-18 VITALS — BP 145/81 | HR 93 | Temp 98.7°F | Resp 16

## 2015-01-18 DIAGNOSIS — F17200 Nicotine dependence, unspecified, uncomplicated: Secondary | ICD-10-CM | POA: Insufficient documentation

## 2015-01-18 DIAGNOSIS — I10 Essential (primary) hypertension: Secondary | ICD-10-CM | POA: Insufficient documentation

## 2015-01-18 LAB — COMPLETE METABOLIC PANEL WITH GFR
ALBUMIN: 4.1 g/dL (ref 3.5–5.2)
ALT: 17 U/L (ref 0–53)
AST: 50 U/L — AB (ref 0–37)
Alkaline Phosphatase: 87 U/L (ref 39–117)
BILIRUBIN TOTAL: 0.4 mg/dL (ref 0.2–1.2)
BUN: 17 mg/dL (ref 6–23)
CALCIUM: 9.3 mg/dL (ref 8.4–10.5)
CHLORIDE: 103 meq/L (ref 96–112)
CO2: 26 meq/L (ref 19–32)
CREATININE: 1.16 mg/dL (ref 0.50–1.35)
GFR, EST NON AFRICAN AMERICAN: 70 mL/min
GFR, Est African American: 81 mL/min
GLUCOSE: 89 mg/dL (ref 70–99)
Potassium: 5 mEq/L (ref 3.5–5.3)
SODIUM: 142 meq/L (ref 135–145)
Total Protein: 7.1 g/dL (ref 6.0–8.3)

## 2015-01-18 LAB — LIPID PANEL
Cholesterol: 199 mg/dL (ref 0–200)
HDL: 91 mg/dL (ref >=40)
LDL CALC: 88 mg/dL (ref 0–99)
Total CHOL/HDL Ratio: 2.2 Ratio
Triglycerides: 98 mg/dL (ref <150)
VLDL: 20 mg/dL (ref 0–40)

## 2015-01-18 LAB — CBC
HCT: 40.5 % (ref 39.0–52.0)
HEMOGLOBIN: 14.3 g/dL (ref 13.0–17.0)
MCH: 33.1 pg (ref 26.0–34.0)
MCHC: 35.3 g/dL (ref 30.0–36.0)
MCV: 93.8 fL (ref 78.0–100.0)
MPV: 9.5 fL (ref 8.6–12.4)
PLATELETS: 279 10*3/uL (ref 150–400)
RBC: 4.32 MIL/uL (ref 4.22–5.81)
RDW: 15.9 % — ABNORMAL HIGH (ref 11.5–15.5)
WBC: 6.3 10*3/uL (ref 4.0–10.5)

## 2015-01-18 NOTE — Patient Instructions (Signed)
Smoking Cessation, Tips for Success If you are ready to quit smoking, congratulations! You have chosen to help yourself be healthier. Cigarettes bring nicotine, tar, carbon monoxide, and other irritants into your body. Your lungs, heart, and blood vessels will be able to work better without these poisons. There are many different ways to quit smoking. Nicotine gum, nicotine patches, a nicotine inhaler, or nicotine nasal spray can help with physical craving. Hypnosis, support groups, and medicines help break the habit of smoking. WHAT THINGS CAN I DO TO MAKE QUITTING EASIER?  Here are some tips to help you quit for good:  Pick a date when you will quit smoking completely. Tell all of your friends and family about your plan to quit on that date.  Do not try to slowly cut down on the number of cigarettes you are smoking. Pick a quit date and quit smoking completely starting on that day.  Throw away all cigarettes.   Clean and remove all ashtrays from your home, work, and car.  On a card, write down your reasons for quitting. Carry the card with you and read it when you get the urge to smoke.  Cleanse your body of nicotine. Drink enough water and fluids to keep your urine clear or pale yellow. Do this after quitting to flush the nicotine from your body.  Learn to predict your moods. Do not let a bad situation be your excuse to have a cigarette. Some situations in your life might tempt you into wanting a cigarette.  Never have "just one" cigarette. It leads to wanting another and another. Remind yourself of your decision to quit.  Change habits associated with smoking. If you smoked while driving or when feeling stressed, try other activities to replace smoking. Stand up when drinking your coffee. Brush your teeth after eating. Sit in a different chair when you read the paper. Avoid alcohol while trying to quit, and try to drink fewer caffeinated beverages. Alcohol and caffeine may urge you to  smoke.  Avoid foods and drinks that can trigger a desire to smoke, such as sugary or spicy foods and alcohol.  Ask people who smoke not to smoke around you.  Have something planned to do right after eating or having a cup of coffee. For example, plan to take a walk or exercise.  Try a relaxation exercise to calm you down and decrease your stress. Remember, you may be tense and nervous for the first 2 weeks after you quit, but this will pass.  Find new activities to keep your hands busy. Play with a pen, coin, or rubber band. Doodle or draw things on paper.  Brush your teeth right after eating. This will help cut down on the craving for the taste of tobacco after meals. You can also try mouthwash.   Use oral substitutes in place of cigarettes. Try using lemon drops, carrots, cinnamon sticks, or chewing gum. Keep them handy so they are available when you have the urge to smoke.  When you have the urge to smoke, try deep breathing.  Designate your home as a nonsmoking area.  If you are a heavy smoker, ask your health care provider about a prescription for nicotine chewing gum. It can ease your withdrawal from nicotine.  Reward yourself. Set aside the cigarette money you save and buy yourself something nice.  Look for support from others. Join a support group or smoking cessation program. Ask someone at home or at work to help you with your plan   to quit smoking.  Always ask yourself, "Do I need this cigarette or is this just a reflex?" Tell yourself, "Today, I choose not to smoke," or "I do not want to smoke." You are reminding yourself of your decision to quit.  Do not replace cigarette smoking with electronic cigarettes (commonly called e-cigarettes). The safety of e-cigarettes is unknown, and some may contain harmful chemicals.  If you relapse, do not give up! Plan ahead and think about what you will do the next time you get the urge to smoke. HOW WILL I FEEL WHEN I QUIT SMOKING? You  may have symptoms of withdrawal because your body is used to nicotine (the addictive substance in cigarettes). You may crave cigarettes, be irritable, feel very hungry, cough often, get headaches, or have difficulty concentrating. The withdrawal symptoms are only temporary. They are strongest when you first quit but will go away within 10-14 days. When withdrawal symptoms occur, stay in control. Think about your reasons for quitting. Remind yourself that these are signs that your body is healing and getting used to being without cigarettes. Remember that withdrawal symptoms are easier to treat than the major diseases that smoking can cause.  Even after the withdrawal is over, expect periodic urges to smoke. However, these cravings are generally short lived and will go away whether you smoke or not. Do not smoke! WHAT RESOURCES ARE AVAILABLE TO HELP ME QUIT SMOKING? Your health care provider can direct you to community resources or hospitals for support, which may include:  Group support.  Education.  Hypnosis.  Therapy. Document Released: 05/10/2004 Document Revised: 12/27/2013 Document Reviewed: 01/28/2013 ExitCare Patient Information 2015 [employer]. This information is not intended to replace advice given to you by your health care provider. Make sure you discuss any questions you have with your health care provider. Smoking Cessation Quitting smoking is important to your health and has many advantages. However, it is not always easy to quit since nicotine is a very addictive drug. Oftentimes, people try 3 times or more before being able to quit. This document explains the best ways for you to prepare to quit smoking. Quitting takes hard work and a lot of effort, but you can do it. ADVANTAGES OF QUITTING SMOKING  You will live longer, feel better, and live better.  Your body will feel the impact of quitting smoking almost immediately.  Within 20 minutes, blood pressure decreases. Your  pulse returns to its normal level.  After 8 hours, carbon monoxide levels in the blood return to normal. Your oxygen level increases.  After 24 hours, the chance of having a heart attack starts to decrease. Your breath, hair, and body stop smelling like smoke.  After 48 hours, damaged nerve endings begin to recover. Your sense of taste and smell improve.  After 72 hours, the body is virtually free of nicotine. Your bronchial tubes relax and breathing becomes easier.  After 2 to 12 weeks, lungs can hold more air. Exercise becomes easier and circulation improves.  The risk of having a heart attack, stroke, cancer, or lung disease is greatly reduced.  After 1 year, the risk of coronary heart disease is cut in half.  After 5 years, the risk of stroke falls to the same as a nonsmoker.  After 10 years, the risk of lung cancer is cut in half and the risk of other cancers decreases significantly.  After 15 years, the risk of coronary heart disease drops, usually to the level of a nonsmoker.  If   you are pregnant, quitting smoking will improve your chances of having a healthy baby.  The people you live with, especially any children, will be healthier.  You will have extra money to spend on things other than cigarettes. QUESTIONS TO THINK ABOUT BEFORE ATTEMPTING TO QUIT You may want to talk about your answers with your health care provider.  Why do you want to quit?  If you tried to quit in the past, what helped and what did not?  What will be the most difficult situations for you after you quit? How will you plan to handle them?  Who can help you through the tough times? Your family? Friends? A health care provider?  What pleasures do you get from smoking? What ways can you still get pleasure if you quit? Here are some questions to ask your health care provider:  How can you help me to be successful at quitting?  What medicine do you think would be best for me and how should I take  it?  What should I do if I need more help?  What is smoking withdrawal like? How can I get information on withdrawal? GET READY  Set a quit date.  Change your environment by getting rid of all cigarettes, ashtrays, matches, and lighters in your home, car, or work. Do not let people smoke in your home.  Review your past attempts to quit. Think about what worked and what did not. GET SUPPORT AND ENCOURAGEMENT You have a better chance of being successful if you have help. You can get support in many ways.  Tell your family, friends, and coworkers that you are going to quit and need their support. Ask them not to smoke around you.  Get individual, group, or telephone counseling and support. Programs are available at local hospitals and health centers. Call your local health department for information about programs in your area.  Spiritual beliefs and practices may help some smokers quit.  Download a "quit meter" on your computer to keep track of quit statistics, such as how long you have gone without smoking, cigarettes not smoked, and money saved.  Get a self-help book about quitting smoking and staying off tobacco. LEARN NEW SKILLS AND BEHAVIORS  Distract yourself from urges to smoke. Talk to someone, go for a walk, or occupy your time with a task.  Change your normal routine. Take a different route to work. Drink tea instead of coffee. Eat breakfast in a different place.  Reduce your stress. Take a hot bath, exercise, or read a book.  Plan something enjoyable to do every day. Reward yourself for not smoking.  Explore interactive web-based programs that specialize in helping you quit. GET MEDICINE AND USE IT CORRECTLY Medicines can help you stop smoking and decrease the urge to smoke. Combining medicine with the above behavioral methods and support can greatly increase your chances of successfully quitting smoking.  Nicotine replacement therapy helps deliver nicotine to your body  without the negative effects and risks of smoking. Nicotine replacement therapy includes nicotine gum, lozenges, inhalers, nasal sprays, and skin patches. Some may be available over-the-counter and others require a prescription.  Antidepressant medicine helps people abstain from smoking, but how this works is unknown. This medicine is available by prescription.  Nicotinic receptor partial agonist medicine simulates the effect of nicotine in your brain. This medicine is available by prescription. Ask your health care provider for advice about which medicines to use and how to use them based on your health   history. Your health care provider will tell you what side effects to look out for if you choose to be on a medicine or therapy. Carefully read the information on the package. Do not use any other product containing nicotine while using a nicotine replacement product.  RELAPSE OR DIFFICULT SITUATIONS Most relapses occur within the first 3 months after quitting. Do not be discouraged if you start smoking again. Remember, most people try several times before finally quitting. You may have symptoms of withdrawal because your body is used to nicotine. You may crave cigarettes, be irritable, feel very hungry, cough often, get headaches, or have difficulty concentrating. The withdrawal symptoms are only temporary. They are strongest when you first quit, but they will go away within 10-14 days. To reduce the chances of relapse, try to:  Avoid drinking alcohol. Drinking lowers your chances of successfully quitting.  Reduce the amount of caffeine you consume. Once you quit smoking, the amount of caffeine in your body increases and can give you symptoms, such as a rapid heartbeat, sweating, and anxiety.  Avoid smokers because they can make you want to smoke.  Do not let weight gain distract you. Many smokers will gain weight when they quit, usually less than 10 pounds. Eat a healthy diet and stay active. You  can always lose the weight gained after you quit.  Find ways to improve your mood other than smoking. FOR MORE INFORMATION  www.smokefree.gov  Document Released: 08/06/2001 Document Revised: 12/27/2013 Document Reviewed: 11/21/2011 ExitCare Patient Information 2015 [employer]. This information is not intended to replace advice given to you by your health care provider. Make sure you discuss any questions you have with your health care provider. DASH Eating Plan DASH stands for "Dietary Approaches to Stop Hypertension." The DASH eating plan is a healthy eating plan that has been shown to reduce high blood pressure (hypertension). Additional health benefits may include reducing the risk of type 2 diabetes mellitus, heart disease, and stroke. The DASH eating plan may also help with weight loss. WHAT DO I NEED TO KNOW ABOUT THE DASH EATING PLAN? For the DASH eating plan, you will follow these general guidelines:  Choose foods with a percent daily value for sodium of less than 5% (as listed on the food label).  Use salt-free seasonings or herbs instead of table salt or sea salt.  Check with your health care provider or pharmacist before using salt substitutes.  Eat lower-sodium products, often labeled as "lower sodium" or "no salt added."  Eat fresh foods.  Eat more vegetables, fruits, and low-fat dairy products.  Choose whole grains. Look for the word "whole" as the first word in the ingredient list.  Choose fish and skinless chicken or turkey more often than red meat. Limit fish, poultry, and meat to 6 oz (170 g) each day.  Limit sweets, desserts, sugars, and sugary drinks.  Choose heart-healthy fats.  Limit cheese to 1 oz (28 g) per day.  Eat more home-cooked food and less restaurant, buffet, and fast food.  Limit fried foods.  Cook foods using methods other than frying.  Limit canned vegetables. If you do use them, rinse them well to decrease the sodium.  When eating at  a restaurant, ask that your food be prepared with less salt, or no salt if possible. WHAT FOODS CAN I EAT? Seek help from a dietitian for individual calorie needs. Grains Whole grain or whole wheat bread. Brown rice. Whole grain or whole wheat pasta. Quinoa, bulgur, and   whole grain cereals. Low-sodium cereals. Corn or whole wheat flour tortillas. Whole grain cornbread. Whole grain crackers. Low-sodium crackers. Vegetables Fresh or frozen vegetables (raw, steamed, roasted, or grilled). Low-sodium or reduced-sodium tomato and vegetable juices. Low-sodium or reduced-sodium tomato sauce and paste. Low-sodium or reduced-sodium canned vegetables.  Fruits All fresh, canned (in natural juice), or frozen fruits. Meat and Other Protein Products Ground beef (85% or leaner), grass-fed beef, or beef trimmed of fat. Skinless chicken or turkey. Ground chicken or turkey. Pork trimmed of fat. All fish and seafood. Eggs. Dried beans, peas, or lentils. Unsalted nuts and seeds. Unsalted canned beans. Dairy Low-fat dairy products, such as skim or 1% milk, 2% or reduced-fat cheeses, low-fat ricotta or cottage cheese, or plain low-fat yogurt. Low-sodium or reduced-sodium cheeses. Fats and Oils Tub margarines without trans fats. Light or reduced-fat mayonnaise and salad dressings (reduced sodium). Avocado. Safflower, olive, or canola oils. Natural peanut or almond butter. Other Unsalted popcorn and pretzels. The items listed above may not be a complete list of recommended foods or beverages. Contact your dietitian for more options. WHAT FOODS ARE NOT RECOMMENDED? Grains White bread. White pasta. White rice. Refined cornbread. Bagels and croissants. Crackers that contain trans fat. Vegetables Creamed or fried vegetables. Vegetables in a cheese sauce. Regular canned vegetables. Regular canned tomato sauce and paste. Regular tomato and vegetable juices. Fruits Dried fruits. Canned fruit in light or heavy syrup. Fruit  juice. Meat and Other Protein Products Fatty cuts of meat. Ribs, chicken wings, bacon, sausage, bologna, salami, chitterlings, fatback, hot dogs, bratwurst, and packaged luncheon meats. Salted nuts and seeds. Canned beans with salt. Dairy Whole or 2% milk, cream, half-and-half, and cream cheese. Whole-fat or sweetened yogurt. Full-fat cheeses or blue cheese. Nondairy creamers and whipped toppings. Processed cheese, cheese spreads, or cheese curds. Condiments Onion and garlic salt, seasoned salt, table salt, and sea salt. Canned and packaged gravies. Worcestershire sauce. Tartar sauce. Barbecue sauce. Teriyaki sauce. Soy sauce, including reduced sodium. Steak sauce. Fish sauce. Oyster sauce. Cocktail sauce. Horseradish. Ketchup and mustard. Meat flavorings and tenderizers. Bouillon cubes. Hot sauce. Tabasco sauce. Marinades. Taco seasonings. Relishes. Fats and Oils Butter, stick margarine, lard, shortening, ghee, and bacon fat. Coconut, palm kernel, or palm oils. Regular salad dressings. Other Pickles and olives. Salted popcorn and pretzels. The items listed above may not be a complete list of foods and beverages to avoid. Contact your dietitian for more information. WHERE CAN I FIND MORE INFORMATION? National Heart, Lung, and [employer] Institute: www.nhlbi.nih.gov/health/health-topics/topics/dash/ Document Released: 08/01/2011 Document Revised: 12/27/2013 Document Reviewed: 06/16/2013 ExitCare Patient Information 2015 [employer]. This information is not intended to replace advice given to you by your health care provider. Make sure you discuss any questions you have with your health care provider.  

## 2015-01-18 NOTE — Progress Notes (Signed)
Patient presents for fasting lab and BP check. Patient had been instructed at last OV to stop amlodipine and restart coreg 12.5 mg bid; however,  patient states not taking any meds at present; wanted to see what BP would be off all meds. Discussed need for low sodium diet and using Mrs. Dash as alternative to salt Encouraged to choose foods with 5% or less of daily value for sodium. Walking 6-10 hours per day for work 5 days per week Patient denies headaches, blurred vision, SHOB, chest pain or pressure Smoking  0.25-0.33 ppd; not trying to quit at present Discussed importance of smoking cessation to reduce risk of heart attack and stroke Patient given 1-800-Quit-Now  Filed Vitals:   01/18/15 0934  BP: 145/81  Pulse: 93  Temp: 98.7 F (37.1 C)  Resp: 16    Patient will restart coreg 12.5 mg bid and return in 2 weeks for nurse visit for BP check  Patient advised to call for med refills at least 7 days before running out so as not to go without.  Patient given literature on DASH Eating Plan, Smoking Cessation and Smoking Cessation Tips for Success

## 2015-01-19 ENCOUNTER — Other Ambulatory Visit: Payer: No Typology Code available for payment source

## 2015-01-19 LAB — PSA: PSA: 2.16 ng/mL (ref <=4.00)

## 2015-01-19 LAB — VITAMIN D 25 HYDROXY (VIT D DEFICIENCY, FRACTURES): VIT D 25 HYDROXY: 26 ng/mL — AB (ref 30–100)

## 2015-01-25 ENCOUNTER — Telehealth: Payer: Self-pay | Admitting: *Deleted

## 2015-01-25 NOTE — Telephone Encounter (Signed)
-----   Message from Ambrose [place]Valerie A Keck, NP sent at 01/23/2015  1:00 PM EDT ----- Cholesterol looks great continue medication.  Vitamin B-12 slightly low, may get over-the-counter vitamin D supplement 800 international units to take daily

## 2015-01-25 NOTE — Telephone Encounter (Signed)
Left HIPAA compliant message for patient to return my call. 

## 2015-02-02 ENCOUNTER — Telehealth: Payer: Self-pay | Admitting: Internal Medicine

## 2015-02-02 NOTE — Telephone Encounter (Signed)
Patient came into facility to speak to nurse about his results,please f/u with pt.

## 2015-02-10 NOTE — Telephone Encounter (Signed)
Patient f/u on results, please f/u with pt asap.

## 2015-02-14 NOTE — Telephone Encounter (Signed)
Nurse called patient, patient verified date of birth. Patient aware of cholesterol looking great and continue mediation.  Patient aware of vitamin B-12 slightly low and agrees to get over the counter vitamin D supplement 800 international units to take daily.  Patient confirmed appointment with nurse tomorrow at 4:30pm for BP check.

## 2015-02-15 ENCOUNTER — Ambulatory Visit: Payer: No Typology Code available for payment source | Attending: Internal Medicine | Admitting: *Deleted

## 2015-02-15 VITALS — BP 107/67 | HR 92 | Temp 98.6°F | Resp 16 | Ht 67.5 in | Wt 105.8 lb

## 2015-02-15 DIAGNOSIS — I10 Essential (primary) hypertension: Secondary | ICD-10-CM | POA: Insufficient documentation

## 2015-02-15 MED ORDER — AZELASTINE HCL 0.1 % NA SOLN: 2.0000 | Freq: Every day | NASAL | Status: DC

## 2015-02-15 MED ORDER — CARVEDILOL 12.5 MG PO TABS: 12.5000 mg | ORAL_TABLET | Freq: Every day | ORAL | Status: DC

## 2015-02-15 NOTE — Progress Notes (Signed)
Patient presents for BP check after restarting coreg 12.5 mg, however, patient states taking only once daily due to feeling nervous for first 30-60 minutes after taking. States this occurs 3-4 days per week. Patient still adding salt to food. Discussed need for low sodium diet and using Mrs. Dash as alternative to salt.  States walking all day at work Patient denies headaches, blurred vision, SHOB, chest pain  C/o clear nasal drainage since last evening. Denies nasal congestion Requesting azelastine nasal spray that he has used in past with relief  Filed Vitals:   02/15/15 1135  BP: 107/67  Pulse: 92  Temp: 98.6 F (37 C)  Resp: 16    Per PCP: Change coreg 12.5 mg on med list to reflect how pt taking Azelastine 2 puffs each nostril; once daily. (E-scribed to [facility] on [facility])  Patient advised to call for med refills at least 7 days before running out so as not to go without.  Patient aware that he is to f/u with PCP 3 months from last visit (Due 04/22/15)

## 2017-05-27 ENCOUNTER — Encounter: Payer: Self-pay | Admitting: Family Medicine

## 2017-05-27 ENCOUNTER — Ambulatory Visit (INDEPENDENT_AMBULATORY_CARE_PROVIDER_SITE_OTHER): Payer: Self-pay | Admitting: Family Medicine

## 2017-05-27 VITALS — BP 144/70 | HR 64 | Temp 98.7°F | Resp 16 | Ht 67.5 in | Wt 107.6 lb

## 2017-05-27 DIAGNOSIS — I10 Essential (primary) hypertension: Secondary | ICD-10-CM

## 2017-05-27 DIAGNOSIS — Z7689 Persons encountering health services in other specified circumstances: Secondary | ICD-10-CM

## 2017-05-27 LAB — POCT URINALYSIS DIP (DEVICE)
BILIRUBIN URINE: NEGATIVE
Glucose, UA: NEGATIVE mg/dL
HGB URINE DIPSTICK: NEGATIVE
KETONES UR: NEGATIVE mg/dL
Leukocytes, UA: NEGATIVE
Nitrite: NEGATIVE
PROTEIN: 100 mg/dL — AB
SPECIFIC GRAVITY, URINE: 1.025 (ref 1.005–1.030)
Urobilinogen, UA: 0.2 mg/dL (ref 0.0–1.0)
pH: 6 (ref 5.0–8.0)

## 2017-05-27 MED ORDER — CARVEDILOL 12.5 MG PO TABS: 12.5000 mg | ORAL_TABLET | Freq: Every day | ORAL | 3 refills | Status: DC

## 2017-05-27 MED FILL — CARVEDILOL 12.5 MG TABLET: 12.5 | 30 days supply | Qty: 30 | Fill #0

## 2017-05-27 NOTE — Patient Instructions (Signed)

## 2017-05-27 NOTE — Progress Notes (Signed)
Patient ID: George Austin, male    DOB: 1956/01/31, 61 y.o.   MRN: [id number]  PCP: Bing Neighbors, FNP  Chief Complaint  Patient presents with  . Establish Care    Subjective:  HPI George Austin is a 61 y.o. male presents to establish care. Medical problems significant for hypertension and current tobacco smoker. George Austin reports no home monitoring of blood pressure and reports adherence to blood pressure medications, although hasn't taken today as he out of medication. He is non-adherent to low sodium diet. Denies any episodes of dizziness,headaches, shortness of breath, or chest pain. George Austin reports no engagement in routine physical activity. Current Body mass index is 16.6 kg/m.  He reports no other complaints or concerns today. Social History   Social History  . Marital status: Single    Spouse name: N/A  . Number of children: N/A  . Years of education: N/A   Occupational History  . Not on file.   Social History Main Topics  . Smoking status: Current Every Day Smoker    Packs/day: 0.50    Years: 30.00    Types: Cigarettes  . Smokeless tobacco: Never Used     Comment: smoking .25 - 0.33 ppd  . Alcohol use 0.0 oz/week  . Drug use: No  . Sexual activity: Not on file   Other Topics Concern  . Not on file   Social History Narrative  . No narrative on file    Family History  Problem Relation Age of Onset  . Diabetes Mother   . Hypertension Father   . Diabetes Brother    Review of Systems See HPI  Patient Active Problem List   Diagnosis Date Noted  . HTN (hypertension) 05/03/2013  . Acute kidney injury (HCC) 05/03/2013  . Right shoulder pain 05/03/2013  . Tobacco use disorder 05/03/2013    No Known Allergies  Prior to Admission medications   Medication Sig Start Date End Date Taking? Authorizing Provider  carvedilol (COREG) 12.5 MG tablet Take 1 tablet (12.5 mg total) by mouth daily. 02/15/15  Yes Ambrose Finland, NP  acetaminophen (TYLENOL) 500 MG  tablet Take 2 tablets (1,000 mg total) by mouth every 8 (eight) hours as needed. Patient not taking: Reported on 12/21/2014 08/12/13   Richarda Overlie, MD  azelastine (ASTELIN) 0.1 % nasal spray Place 2 sprays into both nostrils daily. Use in each nostril as directed Patient not taking: Reported on 05/27/2017 02/15/15   Ambrose Finland, NP  nicotine (EQL NICOTINE) 14 mg/24hr patch Place 1 patch (14 mg total) onto the skin daily. Patient not taking: Reported on 12/21/2014 08/12/13   Richarda Overlie, MD  traMADol (ULTRAM) 50 MG tablet Take 1 tablet (50 mg total) by mouth every 8 (eight) hours as needed. Patient not taking: Reported on 12/21/2014 08/12/13   Richarda Overlie, MD    Past Medical, Surgical Family and Social History reviewed and updated.    Objective:   Today's Vitals   05/27/17 0820  BP: (!) 144/70  Pulse: 64  Resp: 16  Temp: 98.7 F (37.1 C)  TempSrc: Oral  SpO2: 99%  Weight: 107 lb 9.6 oz (48.8 kg)  Height: 5' 7.5" (1.715 m)    Wt Readings from Last 3 Encounters:  05/27/17 107 lb 9.6 oz (48.8 kg)  02/15/15 105 lb 12.8 oz (48 kg)  12/21/14 106 lb (48.1 kg)   Physical Exam  Constitutional: He is oriented to person, place, and time. He appears cachectic.  HENT:  Head: Normocephalic and atraumatic.  Eyes: Pupils are equal, round, and reactive to light. Conjunctivae and EOM are normal. No scleral icterus.  Neck: Normal range of motion. Neck supple.  Cardiovascular: Normal rate, regular rhythm, normal heart sounds and intact distal pulses.   Pulmonary/Chest: Effort normal and breath sounds normal.  Abdominal: Soft. Bowel sounds are normal.  Thinned abdomen   Musculoskeletal: Normal range of motion.  Neurological: He is alert and oriented to person, place, and time.  Skin: Skin is warm and dry.  Psychiatric: He has a normal mood and affect. His behavior is normal. Judgment and thought content normal.   Assessment & Plan:  1. Encounter to establish care 2. Essential  hypertension, uncontrolled today. Resumed carvedilol 12.5 mg daily. At follow-up will recheck BP, if not at goal will add amlodipine 2.5. Pulse rate is low normal, therefore avoid increasing carvedilol.   RTC: 1 month for hypertension recheck and fasting labs, PSA, Hep C screen, with A1C check.    George Austin. Tiburcio Pea, MSN, FNP-C [facility]  [address] [place], [place] [address] [phone]

## 2017-06-10 ENCOUNTER — Other Ambulatory Visit: Payer: Self-pay | Admitting: Family Medicine

## 2017-06-10 ENCOUNTER — Ambulatory Visit: Payer: Self-pay | Admitting: Family Medicine

## 2017-06-10 VITALS — BP 130/92

## 2017-06-10 DIAGNOSIS — Z0130 Encounter for examination of blood pressure without abnormal findings: Secondary | ICD-10-CM

## 2017-06-10 MED ORDER — AMLODIPINE BESYLATE 5 MG PO TABS: 5.0000 mg | ORAL_TABLET | Freq: Every day | ORAL | 3 refills | Status: DC

## 2017-06-10 MED FILL — AMLODIPINE BESYLATE 5 MG TA: 5 | 30 days supply | Qty: 30 | Fill #0

## 2017-06-10 NOTE — Progress Notes (Signed)
Blood pressure , diastolic remains elevated. Adding amlodipine  5 mg once daily to medication regimen.

## 2017-06-10 NOTE — Progress Notes (Signed)
Diastolic still remains rather high adding amlodipine 5 mg once daily for blood pressure control. Patient to return in 4 weeks for hypertension follow-up.  Godfrey Pick. Tiburcio Pea, MSN, FNP-C [facility]  [address] [place], [place] [address] [phone]

## 2017-07-08 ENCOUNTER — Ambulatory Visit (INDEPENDENT_AMBULATORY_CARE_PROVIDER_SITE_OTHER): Payer: No Typology Code available for payment source | Admitting: Family Medicine

## 2017-07-08 ENCOUNTER — Encounter: Payer: Self-pay | Admitting: Family Medicine

## 2017-07-08 VITALS — BP 142/96 | HR 85 | Ht 67.5 in | Wt 107.6 lb

## 2017-07-08 DIAGNOSIS — Z125 Encounter for screening for malignant neoplasm of prostate: Secondary | ICD-10-CM

## 2017-07-08 DIAGNOSIS — Z1159 Encounter for screening for other viral diseases: Secondary | ICD-10-CM

## 2017-07-08 DIAGNOSIS — E559 Vitamin D deficiency, unspecified: Secondary | ICD-10-CM

## 2017-07-08 DIAGNOSIS — I10 Essential (primary) hypertension: Secondary | ICD-10-CM

## 2017-07-08 DIAGNOSIS — Z0000 Encounter for general adult medical examination without abnormal findings: Secondary | ICD-10-CM

## 2017-07-08 DIAGNOSIS — Z131 Encounter for screening for diabetes mellitus: Secondary | ICD-10-CM

## 2017-07-08 DIAGNOSIS — Z1329 Encounter for screening for other suspected endocrine disorder: Secondary | ICD-10-CM

## 2017-07-08 DIAGNOSIS — Z114 Encounter for screening for human immunodeficiency virus [HIV]: Secondary | ICD-10-CM

## 2017-07-08 NOTE — Patient Instructions (Signed)

## 2017-07-08 NOTE — Progress Notes (Signed)
George Austin Blizzard, is a 61 y.o. male  NWG:[id number]  [id number]  DOB - 05/03/1956  CC:  Chief Complaint  Patient presents with  . Annual Exam      HPI: George Austin Barretto is a 61 y.o. male is here today for a complete annual physical. Medical history significant for current everyday smoker, thyroid problems?, and hypertension. George Austin reports over 5 years ago he was treated by endocrinologist for a thyroid disorder, although he is uncertain of what condition he was treated for. He denies treatment for a thyroid nodule or mass.  Reports condition resolved and his medication was discontinued. George Austin was recently started on amlodipine to help with management of blood pressure. He reports since starting medication, he has experienced dizziness if he takes medication with Carvedilol. Reports a family history (sibilibgs) significant for heart problems although is uncertain of which conditions siblings have been treated for in the past. Social History   Socioeconomic History  . Marital status: Single    Spouse name: Not on file  . Number of children: Not on file  . Years of education: Not on file  . Highest education level: Not on file  Social Needs  . Financial resource strain: Not on file  . Food insecurity - worry: Not on file  . Food insecurity - inability: Not on file  . Transportation needs - medical: Not on file  . Transportation needs - non-medical: Not on file  Occupational History  . Not on file  Tobacco Use  . Smoking status: Current Every Day Smoker    Packs/day: 0.50    Years: 30.00    Pack years: 15.00    Types: Cigarettes  . Smokeless tobacco: Never Used  . Tobacco comment: smoking .25 - 0.33 ppd  Substance and Sexual Activity  . Alcohol use: Yes    Alcohol/week: 0.0 oz  . Drug use: No  . Sexual activity: Not on file  Other Topics Concern  . Not on file  Social History Narrative  . Not on file     No Known Allergies Past Medical History:  Diagnosis Date  .  Hypertension    Current Outpatient Medications on File Prior to Visit  Medication Sig Dispense Refill  . acetaminophen (TYLENOL) 500 MG tablet Take 2 tablets (1,000 mg total) by mouth every 8 (eight) hours as needed. (Patient not taking: Reported on 12/21/2014) 30 tablet 0  . amLODipine (NORVASC) 5 MG tablet Take 1 tablet (5 mg total) by mouth daily. 90 tablet 3  . azelastine (ASTELIN) 0.1 % nasal spray Place 2 sprays into both nostrils daily. Use in each nostril as directed (Patient not taking: Reported on 05/27/2017) 30 mL 3  . carvedilol (COREG) 12.5 MG tablet Take 1 tablet (12.5 mg total) by mouth daily. 30 tablet 3  . nicotine (EQL NICOTINE) 14 mg/24hr patch Place 1 patch (14 mg total) onto the skin daily. (Patient not taking: Reported on 12/21/2014) 28 patch 2  . traMADol (ULTRAM) 50 MG tablet Take 1 tablet (50 mg total) by mouth every 8 (eight) hours as needed. (Patient not taking: Reported on 12/21/2014) 60 tablet 2   No current facility-administered medications on file prior to visit.    Family History  Problem Relation Age of Onset  . Diabetes Mother   . Hypertension Father   . Diabetes Brother    Social History   Socioeconomic History  . Marital status: Single    Spouse name: Not on file  . Number of children: Not on file  .  Years of education: Not on file  . Highest education level: Not on file  Social Needs  . Financial resource strain: Not on file  . Food insecurity - worry: Not on file  . Food insecurity - inability: Not on file  . Transportation needs - medical: Not on file  . Transportation needs - non-medical: Not on file  Occupational History  . Not on file  Tobacco Use  . Smoking status: Current Every Day Smoker    Packs/day: 0.50    Years: 30.00    Pack years: 15.00    Types: Cigarettes  . Smokeless tobacco: Never Used  . Tobacco comment: smoking .25 - 0.33 ppd  Substance and Sexual Activity  . Alcohol use: Yes    Alcohol/week: 0.0 oz  . Drug use: No  .  Sexual activity: Not on file  Other Topics Concern  . Not on file  Social History Narrative  . Not on file    Review of Systems: Constitutional: Negative for fever, chills, diaphoresis, activity change, appetite change and fatigue. HENT: Negative for ear pain, nosebleeds, congestion, facial swelling, rhinorrhea, neck pain, neck stiffness and ear discharge.  Eyes: Negative for pain, discharge, redness, itching and visual disturbance. Respiratory: Negative for cough, choking, chest tightness, shortness of breath, wheezing and stridor.  Cardiovascular: Negative for chest pain, palpitations and leg swelling. EKG significant NSR, LV hypertrophy Gastrointestinal: Negative for abdominal distention. Genitourinary: Negative for dysuria, urgency, frequency, hematuria, flank pain, decreased urine volume, difficulty urinating. Musculoskeletal: Negative for back pain, joint swelling, arthralgia and gait problem. Neurological: Negative for dizziness, tremors, seizures, syncope, facial asymmetry, speech difficulty, weakness, light-headedness, numbness and headaches.  Hematological: Negative for adenopathy. Does not bruise/bleed easily. Psychiatric/Behavioral: Negative for hallucinations, behavioral problems, confusion, dysphoric mood, decreased concentration and agitation.    Objective:   Vitals:   07/08/17 0812  BP: (!) 142/96  Pulse: 85  SpO2: 100%    Physical Exam: Constitutional: Patient appears well-developed and well-nourished. No distress. HENT: Normocephalic, atraumatic, External right and left ear normal. Oropharynx is clear and moist.  Eyes: Conjunctivae and EOM are normal. PERRLA, no scleral icterus. Neck: Normal ROM. Neck supple. No JVD. No tracheal deviation. No thyromegaly. CVS: RRR, S1/S2 +, no murmurs, no gallops, no carotid bruit.  Pulmonary: Effort and breath sounds normal, no stridor, rhonchi, wheezes, rales.  Abdominal: Soft. BS +, no distension, tenderness, rebound or  guarding.  Musculoskeletal: Normal range of motion. No edema and no tenderness.  Lymphadenopathy: No lymphadenopathy noted, cervical, inguinal or axillary Neuro: Alert. Normal reflexes, muscle tone coordination. No cranial nerve deficit. Skin: Skin is warm and dry. No rash noted. Not diaphoretic. No erythema. No pallor. Psychiatric: Normal mood and affect. Behavior, judgment, thought content normal.  Lab Results  Component Value Date   WBC 6.3 01/18/2015   HGB 14.3 01/18/2015   HCT 40.5 01/18/2015   MCV 93.8 01/18/2015   PLT 279 01/18/2015   Lab Results  Component Value Date   CREATININE 1.16 01/18/2015   BUN 17 01/18/2015   NA 142 01/18/2015   K 5.0 01/18/2015   CL 103 01/18/2015   CO2 26 01/18/2015    Lab Results  Component Value Date   HGBA1C 5.4 08/12/2013   Lipid Panel     Component Value Date/Time   CHOL 199 01/18/2015 0918   TRIG 98 01/18/2015 0918   HDL 91 01/18/2015 0918   CHOLHDL 2.2 01/18/2015 0918   VLDL 20 01/18/2015 0918   LDLCALC 88 01/18/2015 [id number]  Assessment and plan:  1. Essential hypertension, elevated today. Patient has not taken medication today due to fasting for labs. Complains of dizziness with amlodipine. Recommended avoiding taking both antihypertensive medications at the same time. Amlodipine may be better tolerated at bedtime. Goal BP 140/90. Encouraged smoking cessation.  2. Screening for diabetes mellitus, checking A1C today.   3. Vitamin D deficiency, hx of deficiency, currently not prescribed replacement.   4. Need for hepatitis C screening test-Hepatitis c vrs RNA detect by PCR-qual  5. Screening PSA (prostate specific antigen)- PSA  6. Annual Physical Exam -age appropriate anticipatory guidance provided.   Orders Placed This Encounter  Procedures  . Hepatitis c vrs RNA detect by PCR-qual  . PSA  . CBC with Differential  . COMPLETE METABOLIC PANEL WITH GFR  . Hemoglobin A1c  . Vitamin D, 25-hydroxy  . Thyroid Panel  With TSH  . Lipid panel  . HIV antibody (with reflex)  . EKG 12-Lead    Return in about 6 months (around 01/05/2018), or hypertension .  The patient was given clear instructions to go to ER or return to medical center if symptoms don't improve, worsen or new problems develop. The patient verbalized understanding. The patient was told to call to get lab results if they haven't heard anything in the next week.     This note has been created with Education officer, environmental. Any transcriptional errors are unintentional.

## 2017-07-09 LAB — THYROID PANEL WITH TSH
Free Thyroxine Index: 2.3 (ref 1.4–3.8)
T3 UPTAKE: 34 % (ref 22–35)
T4 TOTAL: 6.7 ug/dL (ref 4.9–10.5)
TSH: 0.73 m[IU]/L (ref 0.40–4.50)

## 2017-07-09 LAB — LIPID PANEL
CHOLESTEROL: 221 mg/dL — AB (ref <200)
HDL: 90 mg/dL (ref >40)
LDL Cholesterol (Calc): 112 mg/dL (calc) — ABNORMAL HIGH
Non-HDL Cholesterol (Calc): 131 mg/dL (calc) — ABNORMAL HIGH (ref <130)
Total CHOL/HDL Ratio: 2.5 (calc) (ref <5.0)
Triglycerides: 88 mg/dL (ref <150)

## 2017-07-09 LAB — HIV ANTIBODY (ROUTINE TESTING W REFLEX): HIV 1&2 Ab, 4th Generation: NONREACTIVE

## 2017-07-12 ENCOUNTER — Telehealth: Payer: Self-pay | Admitting: Family Medicine

## 2017-07-12 DIAGNOSIS — Z79899 Other long term (current) drug therapy: Secondary | ICD-10-CM

## 2017-07-12 LAB — COMPLETE METABOLIC PANEL WITH GFR
AG RATIO: 1.5 (calc) (ref 1.0–2.5)
ALBUMIN MSPROF: 4.4 g/dL (ref 3.6–5.1)
ALKALINE PHOSPHATASE (APISO): 69 U/L (ref 40–115)
ALT: 17 U/L (ref 9–46)
AST: 40 U/L — ABNORMAL HIGH (ref 10–35)
BUN / CREAT RATIO: 15 (calc) (ref 6–22)
BUN: 20 mg/dL (ref 7–25)
CALCIUM: 9.5 mg/dL (ref 8.6–10.3)
CO2: 26 mmol/L (ref 20–32)
CREATININE: 1.36 mg/dL — AB (ref 0.70–1.33)
Chloride: 97 mmol/L — ABNORMAL LOW (ref 98–110)
GFR, EST NON AFRICAN AMERICAN: 57 mL/min/{1.73_m2} — AB (ref >=60)
GFR, Est African American: 66 mL/min/{1.73_m2} (ref >=60)
GLOBULIN: 3 g/dL (ref 1.9–3.7)
Glucose, Bld: 118 mg/dL — ABNORMAL HIGH (ref 65–99)
POTASSIUM: 5 mmol/L (ref 3.5–5.3)
SODIUM: 136 mmol/L (ref 135–146)
Total Bilirubin: 0.6 mg/dL (ref 0.2–1.2)
Total Protein: 7.4 g/dL (ref 6.1–8.1)

## 2017-07-12 LAB — HEMOGLOBIN A1C
Hgb A1c MFr Bld: 5.3 % of total Hgb (ref <5.7)
Mean Plasma Glucose: 105 (calc)
eAG (mmol/L): 5.8 (calc)

## 2017-07-12 LAB — CBC WITH DIFFERENTIAL/PLATELET
BASOS ABS: 40 {cells}/uL (ref 0–200)
Basophils Relative: 0.6 %
EOS PCT: 3.3 %
Eosinophils Absolute: 218 cells/uL (ref 15–500)
HEMATOCRIT: 39.3 % (ref 38.5–50.0)
Hemoglobin: 13.8 g/dL (ref 13.2–17.1)
LYMPHS ABS: 2244 {cells}/uL (ref 850–3900)
MCH: 33.4 pg — ABNORMAL HIGH (ref 27.0–33.0)
MCHC: 35.1 g/dL (ref 32.0–36.0)
MCV: 95.2 fL (ref 80.0–100.0)
MPV: 10.1 fL (ref 7.5–12.5)
Monocytes Relative: 11.7 %
NEUTROS ABS: 3326 {cells}/uL (ref 1500–7800)
NEUTROS PCT: 50.4 %
Platelets: 228 10*3/uL (ref 140–400)
RBC: 4.13 10*6/uL — ABNORMAL LOW (ref 4.20–5.80)
RDW: 13.3 % (ref 11.0–15.0)
Total Lymphocyte: 34 %
WBC: 6.6 10*3/uL (ref 3.8–10.8)
WBCMIX: 772 {cells}/uL (ref 200–950)

## 2017-07-12 LAB — HEPATITIS C VRS RNA DETECT BY PCR-QUAL: Hepatitis C Vrs RNA by PCR-Qual: NOT DETECTED

## 2017-07-12 LAB — PSA: PSA: 2.3 ng/mL (ref <=4.0)

## 2017-07-12 LAB — VITAMIN D 25 HYDROXY (VIT D DEFICIENCY, FRACTURES): Vit D, 25-Hydroxy: 18 ng/mL — ABNORMAL LOW (ref 30–100)

## 2017-07-12 MED ORDER — PRAVASTATIN SODIUM 40 MG PO TABS: 40.0000 mg | ORAL_TABLET | Freq: Every day | ORAL | 3 refills | Status: DC

## 2017-07-12 MED ORDER — VITAMIN D (ERGOCALCIFEROL) 1.25 MG (50000 UNIT) PO CAPS: 50000.0000 [IU] | ORAL_CAPSULE | ORAL | 2 refills | Status: DC

## 2017-07-12 NOTE — Telephone Encounter (Signed)
Contact patient to advise his cholesterol panel is abnormal elevated and his vitamin D is low. I am starting him on pravastatin 40 mg once daily at 6 pm and vitamin D replacement 50,000 units once weekly x 12 weeks.  He needs to return in 6 weeks to re-check liver enzymes.  Godfrey PickKimberly S. Tiburcio PeaHarris, MSN, FNP-C [facility]  [address] [place], [place] [address] [phone]

## 2017-07-12 NOTE — Telephone Encounter (Signed)
Order pravastatin 40 and ergocalciferol 50,000 units weekly

## 2017-07-14 NOTE — Telephone Encounter (Signed)
Tried to contact patient no answer and no vm setup to leave a message.

## 2017-07-16 MED FILL — ?CARVEDILOL 12.5 MG TABLET: 12.5 | 30 days supply | Qty: 30 | Fill #1

## 2017-07-21 MED FILL — AMLODIPINE BESYLATE 5 MG TA: 5 | 30 days supply | Qty: 30 | Fill #1

## 2017-09-11 NOTE — Telephone Encounter (Signed)
Tried to contact patient no answer and detailed message left on his vm.

## 2017-09-15 MED FILL — ?CARVEDILOL 12.5 MG TABLET: 12.5 | 30 days supply | Qty: 30 | Fill #2

## 2017-09-16 ENCOUNTER — Other Ambulatory Visit: Payer: Self-pay

## 2017-09-16 MED ORDER — VITAMIN D (ERGOCALCIFEROL) 1.25 MG (50000 UNIT) PO CAPS: 50000.0000 [IU] | ORAL_CAPSULE | ORAL | 2 refills | Status: DC

## 2017-09-16 MED ORDER — PRAVASTATIN SODIUM 40 MG PO TABS: 40.0000 mg | ORAL_TABLET | Freq: Every day | ORAL | 3 refills | Status: DC

## 2017-09-16 MED FILL — ?PRAVASTATIN SODIUM 40MG TA: 40 | 30 days supply | Qty: 30 | Fill #0

## 2017-09-16 MED FILL — VIT D2 1.25 MG (50,000 UNIT: 1.25 MG | 28 days supply | Qty: 4 | Fill #0

## 2017-09-16 NOTE — Telephone Encounter (Signed)
Patient called back and stated that pharmacy didn't have his medication yet. Patient was advise that medication will be resent to [facility]. Patient states that he is not in a rush to pick up medications because he has never been on them before. Patient was advise to follow providers care regarding medications

## 2017-11-21 MED FILL — PRAVASTATIN NA 40 MG TAB: 40 | 30 days supply | Qty: 30 | Fill #1

## 2017-11-21 MED FILL — CARVEDILOL 12.5 MG TABLET: 12.5 | 30 days supply | Qty: 30 | Fill #3

## 2017-11-24 MED FILL — AMLODIPINE BESYLATE 5 MG TA: 5 | 30 days supply | Qty: 30 | Fill #2

## 2018-01-05 ENCOUNTER — Ambulatory Visit: Payer: Self-pay | Admitting: Family Medicine

## 2018-01-14 ENCOUNTER — Encounter: Payer: Self-pay | Admitting: Family Medicine

## 2018-01-14 ENCOUNTER — Ambulatory Visit (INDEPENDENT_AMBULATORY_CARE_PROVIDER_SITE_OTHER): Payer: Self-pay | Admitting: Family Medicine

## 2018-01-14 VITALS — BP 132/74 | HR 90 | Temp 98.0°F | Ht 67.5 in | Wt 108.0 lb

## 2018-01-14 DIAGNOSIS — IMO0001 Reserved for inherently not codable concepts without codable children: Secondary | ICD-10-CM

## 2018-01-14 DIAGNOSIS — N529 Male erectile dysfunction, unspecified: Secondary | ICD-10-CM

## 2018-01-14 DIAGNOSIS — Z09 Encounter for follow-up examination after completed treatment for conditions other than malignant neoplasm: Secondary | ICD-10-CM

## 2018-01-14 DIAGNOSIS — I10 Essential (primary) hypertension: Secondary | ICD-10-CM

## 2018-01-14 DIAGNOSIS — E559 Vitamin D deficiency, unspecified: Secondary | ICD-10-CM

## 2018-01-14 DIAGNOSIS — Z716 Tobacco abuse counseling: Secondary | ICD-10-CM

## 2018-01-14 DIAGNOSIS — F17200 Nicotine dependence, unspecified, uncomplicated: Secondary | ICD-10-CM

## 2018-01-14 DIAGNOSIS — E785 Hyperlipidemia, unspecified: Secondary | ICD-10-CM

## 2018-01-14 LAB — POCT URINALYSIS DIP (MANUAL ENTRY)
Bilirubin, UA: NEGATIVE
Blood, UA: NEGATIVE
Glucose, UA: NEGATIVE mg/dL
Leukocytes, UA: NEGATIVE
Nitrite, UA: NEGATIVE
Protein Ur, POC: 100 mg/dL — AB
Spec Grav, UA: 1.02 (ref 1.010–1.025)
Urobilinogen, UA: 0.2 E.U./dL
pH, UA: 5.5 (ref 5.0–8.0)

## 2018-01-14 MED ORDER — SILDENAFIL CITRATE 25 MG PO TABS: 25.0000 mg | ORAL_TABLET | Freq: Every day | ORAL | 0 refills | Status: DC | PRN

## 2018-01-14 MED ORDER — CARVEDILOL 12.5 MG PO TABS: 12.5000 mg | ORAL_TABLET | Freq: Every day | ORAL | 3 refills | Status: DC

## 2018-01-14 MED FILL — !VIAGRA 25 MG TABLET: 25 | 30 days supply | Qty: 10 | Fill #0

## 2018-01-14 MED FILL — CARVEDILOL 12.5 MG TABLET: 12.5 | 30 days supply | Qty: 30 | Fill #0

## 2018-01-14 NOTE — Patient Instructions (Signed)
Sildenafil tablets (Viagra) What is this medicine? SILDENAFIL (sil DEN a fil) is used to treat erection problems in men. This medicine may be used for other purposes; ask your health care provider or pharmacist if you have questions. COMMON BRAND NAME(S): Viagra What should I tell my health care provider before I take this medicine? They need to know if you have any of these conditions: -bleeding disorders -eye or vision problems, including a rare inherited eye disease called retinitis pigmentosa -anatomical deformation of the penis, Peyronie's disease, or history of priapism (painful and prolonged erection) -heart disease, angina, a history of heart attack, irregular heart beats, or other heart problems -high or low blood pressure -history of blood diseases, like sickle cell anemia or leukemia -history of stomach bleeding -kidney disease -liver disease -stroke -an unusual or allergic reaction to sildenafil, other medicines, foods, dyes, or preservatives -pregnant or trying to get pregnant -breast-feeding How should I use this medicine? Take this medicine by mouth with a glass of water. Follow the directions on the prescription label. The dose is usually taken 1 hour before sexual activity. You should not take the dose more than once per day. Do not take your medicine more often than directed. Talk to your pediatrician regarding the use of this medicine in children. This medicine is not used in children for this condition. Overdosage: If you think you have taken too much of this medicine contact a poison control center or emergency room at once. NOTE: This medicine is only for you. Do not share this medicine with others. What if I miss a dose? This does not apply. Do not take double or extra doses. What may interact with this medicine? Do not take this medicine with any of the following medications: -cisapride -nitrates like amyl nitrite, isosorbide dinitrate, isosorbide mononitrate,  nitroglycerin -riociguat This medicine may also interact with the following medications: -antiviral medicines for HIV or AIDS -bosentan -certain medicines for benign prostatic hyperplasia (BPH) -certain medicines for blood pressure -certain medicines for fungal infections like ketoconazole and itraconazole -cimetidine -erythromycin -rifampin This list may not describe all possible interactions. Give your health care provider a list of all the medicines, herbs, non-prescription drugs, or dietary supplements you use. Also tell them if you smoke, drink alcohol, or use illegal drugs. Some items may interact with your medicine. What should I watch for while using this medicine? If you notice any changes in your vision while taking this drug, call your doctor or health care professional as soon as possible. Stop using this medicine and call your health care provider right away if you have a loss of sight in one or both eyes. Contact your doctor or health care professional right away if you have an erection that lasts longer than 4 hours or if it becomes painful. This may be a sign of a serious problem and must be treated right away to prevent permanent damage. If you experience symptoms of nausea, dizziness, chest pain or arm pain upon initiation of sexual activity after taking this medicine, you should refrain from further activity and call your doctor or health care professional as soon as possible. Do not drink alcohol to excess (examples, 5 glasses of wine or 5 shots of whiskey) when taking this medicine. When taken in excess, alcohol can increase your chances of getting a headache or getting dizzy, increasing your heart rate or lowering your blood pressure. Using this medicine does not protect you or your partner against HIV infection (the virus that causes   AIDS) or other sexually transmitted diseases. What side effects may I notice from receiving this medicine? Side effects that you should report  to your doctor or health care professional as soon as possible: -allergic reactions like skin rash, itching or hives, swelling of the face, lips, or tongue -breathing problems -changes in hearing -changes in vision -chest pain -fast, irregular heartbeat -prolonged or painful erection -seizures Side effects that usually do not require medical attention (report to your doctor or health care professional if they continue or are bothersome): -back pain -dizziness -flushing -headache -indigestion -muscle aches -nausea -stuffy or runny nose This list may not describe all possible side effects. Call your doctor for medical advice about side effects. You may report side effects to FDA at 1-800-FDA-1088. Where should I keep my medicine? Keep out of reach of children. Store at room temperature between 15 and 30 degrees C (59 and 86 degrees F). Throw away any unused medicine after the expiration date. NOTE: This sheet is a summary. It may not cover all possible information. If you have questions about this medicine, talk to your doctor, pharmacist, or health care provider.  2018 Elsevier/Gold Standard (2015-07-26 12:00:25)  

## 2018-01-14 NOTE — Progress Notes (Signed)
Subjective:     Patient ID: George Austin, male   DOB: Aug 30, 1955, 62 y.o.   MRN: [id number]   PCP: Raliegh Ip, NP  Chief Complaint  Patient presents with  . Follow-up    HTN    HPI  George Austin has history of Hypertension. He is here today for his 6 months follow up.  Current Status: Patient states that he is doing well today. Denies fevers, chills, recent infections, fatigue, weight loss, and night sweats. He states that he does have mild allergies, which he drinks tea with honey for relief of symptoms. He denies cough, shortness of breath, heart palpitations, and chest pain. He denies visual changes, headaches, dizziness, lightheadedness, and falls.   He states that he would like to try a medication to boost libido.   He has not had any bleeding incidents.   He has a good appetite, and his bowel movements are normal. He denies abdominal pain, nausea, vomiting, diarrhea, and constipation.   He denies neuropathy and pain today.    He continues to smoke 1 pack of cigarettes over a course of 3 days.   Past Medical History:  Diagnosis Date  . Hypertension     Family History  Problem Relation Age of Onset  . Diabetes Mother   . Hypertension Father   . Diabetes Brother     Social History   Tobacco Use  . Smoking status: Current Every Day Smoker    Packs/day: 0.50    Years: 30.00    Pack years: 15.00    Types: Cigarettes  . Smokeless tobacco: Never Used  . Tobacco comment: smoking .25 - 0.33 ppd  Substance Use Topics  . Alcohol use: Yes    Alcohol/week: 0.0 oz  . Drug use: No     There is no immunization history on file for this patient.  Current Meds  Medication Sig  . amLODipine (NORVASC) 5 MG tablet Take 1 tablet (5 mg total) by mouth daily.  . pravastatin (PRAVACHOL) 40 MG tablet Take 1 tablet (40 mg total) by mouth daily.  . Vitamin D, Ergocalciferol, (DRISDOL) 50000 units CAPS capsule Take 1 capsule (50,000 Units total) by mouth every 7 (seven)  days.    No Known Allergies  BP 132/74 (BP Location: Left Arm, Patient Position: Sitting, Cuff Size: Small)   Pulse 90   Temp 98 F (36.7 C) (Oral)   Ht 5' 7.5" (1.715 m)   Wt 108 lb (49 kg)   SpO2 97%   BMI 16.67 kg/m    Review of Systems  Constitutional: Negative.   HENT: Negative.   Eyes: Negative.   Respiratory: Negative.   Cardiovascular: Negative.   Gastrointestinal: Negative.   Endocrine: Negative.   Genitourinary: Negative.   Musculoskeletal: Negative.   Skin: Negative.   Allergic/Immunologic: Negative.   Neurological: Negative.   Hematological: Negative.   Psychiatric/Behavioral: Negative.       Objective:   Physical Exam  Constitutional: He is oriented to person, place, and time. He appears well-developed and well-nourished.  HENT:  Head: Normocephalic and atraumatic.  Right Ear: External ear normal.  Left Ear: External ear normal.  Nose: Nose normal.  Mouth/Throat: Oropharynx is clear and moist.  Eyes: Pupils are equal, round, and reactive to light. Conjunctivae and EOM are normal.  Neck: Normal range of motion. Neck supple.  Cardiovascular: Normal rate, regular rhythm, normal heart sounds and intact distal pulses.  Pulmonary/Chest: Effort normal and breath sounds normal.  Abdominal: Soft. Bowel sounds are  normal.  Musculoskeletal: Normal range of motion.  Neurological: He is alert and oriented to person, place, and time.  Skin: Skin is warm and dry. Capillary refill takes less than 2 seconds.  Psychiatric: He has a normal mood and affect. His behavior is normal. Judgment and thought content normal.  Nursing note and vitals reviewed.    Assessment:   1. Essential hypertension 2. Vitamin D deficiency 3. Hyperlipidemia, unspecified hyperlipidemia type 4. Tobacco abuse counseling 5. Smoking 6. Erectile dysfunction, unspecified erectile dysfunction type 7. Follow up  Plan:   1. Essential hypertension Blood pressure is stable today at 132/74.  He will continue Carvedilol as directed.  - carvedilol (COREG) 12.5 MG tablet; Take 1 tablet (12.5 mg total) by mouth daily.  Dispense: 30 tablet; Refill: 3 - CBC with Differential - Comprehensive metabolic panel; Future - Comprehensive metabolic panel  2. Vitamin D deficiency Vitamin D levels were decreased at 18 on 07/08/2017. We will add Vitamin D to labs draw for today. He will continue to take Vitamin D daily as prescribed.   3. Hyperlipidemia, unspecified hyperlipidemia type Cholesterol level was elevated at 221 on 07/08/2018. Lipid panel today. Continue Pravastatin as daily as directed.  - Lipid Panel  4. Tobacco abuse counseling Smoking Cessation offered. Patient will contact office when ready to quit smoking.   5. Smoking He is currently smoking 1 pack of cigarettes within a 3 day period. Patient is aware risks r/t cigarette smoking. We will continue to encourage him to quit.   6. Erectile dysfunction We will initiate trial dose of medication today. Rx for Sildenofil 25 mg # 10 tablets to pharmacy.   The patient desires Viagra to treat his erectile dysfunction. History and physical exam has not disclosed any obvious treatable cause of this complaint. He is informed that Viagra is sometimes not covered by insurance. He will be initiated on 25 mg daily. The method of use 1 hour prior to anticipated intercourse is explained. He should not use any more than one tablet in a 24 hour period. The side effects of possible headache, flushing, dyspepsia and transient changes in vision have been explained.   The patient is not taking nitrates, and denies he has access to nitrates in any form at any time. I have counseled him that taking Viagra with nitrates of any form can cause death. Additionally, Viagra serum concentrations can be increased by the following: cimetidine, erythromycin, itraconazole or ketoconazole. This patient does not take these drugs, but I have counseled him to avoid Viagra  if he does take any of these.  We have also discussed the fact that there have been some deaths in patients after taking Viagra, felt due to the exertion of intercourse rather than the drug itself. The patient is aware of this, and accepts whatever unknown degree of risk there is in this aspect.  7. Follow up Follow up in 1 month to assess effectiveness of Sildenofil.   Meds ordered this encounter  Medications  . carvedilol (COREG) 12.5 MG tablet    Sig: Take 1 tablet (12.5 mg total) by mouth daily.    Dispense:  30 tablet    Refill:  3  . sildenafil (VIAGRA) 25 MG tablet    Sig: Take 1 tablet (25 mg total) by mouth daily as needed for erectile dysfunction.    Dispense:  10 tablet    Refill:  0   Raliegh Ip,  MSN, FNP-BC Patient Care [facility] [address] [place]  [place], [place] [address] [phone]

## 2018-01-15 LAB — COMPREHENSIVE METABOLIC PANEL
ALT: 29 IU/L (ref 0–44)
AST: 63 IU/L — ABNORMAL HIGH (ref 0–40)
Albumin/Globulin Ratio: 1.7 (ref 1.2–2.2)
Albumin: 4.7 g/dL (ref 3.5–5.5)
Alkaline Phosphatase: 76 IU/L (ref 39–117)
BUN/Creatinine Ratio: 10 (ref 9–20)
BUN: 15 mg/dL (ref 6–24)
Bilirubin Total: 0.3 mg/dL (ref 0.0–1.2)
CO2: 25 mmol/L (ref 20–29)
Calcium: 9.8 mg/dL (ref 8.7–10.2)
Chloride: 96 mmol/L (ref 96–106)
Creatinine, Ser: 1.47 mg/dL — ABNORMAL HIGH (ref 0.76–1.27)
GFR calc Af Amer: 60 mL/min/{1.73_m2} (ref >59)
GFR calc non Af Amer: 51 mL/min/{1.73_m2} — ABNORMAL LOW (ref >59)
Globulin, Total: 2.7 g/dL (ref 1.5–4.5)
Glucose: 113 mg/dL — ABNORMAL HIGH (ref 65–99)
Potassium: 4.3 mmol/L (ref 3.5–5.2)
Sodium: 140 mmol/L (ref 134–144)
Total Protein: 7.4 g/dL (ref 6.0–8.5)

## 2018-01-15 LAB — CBC WITH DIFFERENTIAL/PLATELET
Basophils Absolute: 0 10*3/uL (ref 0.0–0.2)
Basos: 1 %
EOS (ABSOLUTE): 0.3 10*3/uL (ref 0.0–0.4)
Eos: 4 %
Hematocrit: 39.1 % (ref 37.5–51.0)
Hemoglobin: 13.3 g/dL (ref 13.0–17.7)
Immature Grans (Abs): 0 10*3/uL (ref 0.0–0.1)
Immature Granulocytes: 0 %
Lymphocytes Absolute: 3.1 10*3/uL (ref 0.7–3.1)
Lymphs: 50 %
MCH: 32.8 pg (ref 26.6–33.0)
MCHC: 34 g/dL (ref 31.5–35.7)
MCV: 96 fL (ref 79–97)
Monocytes Absolute: 0.5 10*3/uL (ref 0.1–0.9)
Monocytes: 9 %
Neutrophils Absolute: 2.2 10*3/uL (ref 1.4–7.0)
Neutrophils: 36 %
Platelets: 251 10*3/uL (ref 150–450)
RBC: 4.06 x10E6/uL — ABNORMAL LOW (ref 4.14–5.80)
RDW: 14.6 % (ref 12.3–15.4)
WBC: 6.1 10*3/uL (ref 3.4–10.8)

## 2018-01-15 LAB — LIPID PANEL
Chol/HDL Ratio: 2 ratio (ref 0.0–5.0)
Cholesterol, Total: 198 mg/dL (ref 100–199)
HDL: 98 mg/dL (ref >39)
LDL Calculated: 85 mg/dL (ref 0–99)
Triglycerides: 76 mg/dL (ref 0–149)
VLDL Cholesterol Cal: 15 mg/dL (ref 5–40)

## 2018-01-17 LAB — VITAMIN D 25 HYDROXY (VIT D DEFICIENCY, FRACTURES): Vit D, 25-Hydroxy: 32.1 ng/mL (ref 30.0–100.0)

## 2018-01-17 LAB — SPECIMEN STATUS REPORT

## 2018-01-20 ENCOUNTER — Telehealth: Payer: Self-pay

## 2018-01-20 NOTE — Telephone Encounter (Signed)
Called patient no answer and no vm set up

## 2018-01-20 NOTE — Telephone Encounter (Signed)
-----   Message from Kallie Locks, FNP sent at 01/16/2018  4:31 PM EDT ----- Regarding: "Lab Results" Lab results continue to be stable.  Patient continues to have slight elevation in Creatinine level.  He will continue to increase fluids and remain well hydrated.

## 2018-01-21 NOTE — Telephone Encounter (Signed)
-----   Message from Natalie M Stroud, FNP sent at 01/16/2018  4:31 PM EDT ----- Regarding: "Lab Results" Lab results continue to be stable.  Patient continues to have slight elevation in Creatinine level.  He will continue to increase fluids and remain well hydrated.  

## 2018-01-21 NOTE — Telephone Encounter (Signed)
Called patient - no answer and no vm  ?

## 2018-01-26 NOTE — Telephone Encounter (Signed)
Letter mailed out as I have been unable to reach patient by phone.

## 2018-01-26 NOTE — Telephone Encounter (Signed)
-----   Message from Kallie LocksNatalie M Stroud, FNP sent at 01/16/2018  4:31 PM EDT ----- Regarding: "Lab Results" Lab results continue to be stable.  Patient continues to have slight elevation in Creatinine level.  He will continue to increase fluids and remain well hydrated.

## 2018-02-13 ENCOUNTER — Telehealth: Payer: Self-pay

## 2018-02-13 NOTE — Telephone Encounter (Signed)
Tried to contact patient no answer and no vm 

## 2018-02-13 NOTE — Telephone Encounter (Signed)
-----   Message from Kallie LocksNatalie M Stroud, FNP sent at 02/12/2018 11:18 PM EDT ----- Regarding: "Labs Only" Vitamin D level is decreased. Please schedule patient to come in to have Vitamin D level drawn one day next week.   He should take OTC Vitamin D supplement until, when results are back will further advise.    Thanks!

## 2018-02-18 NOTE — Telephone Encounter (Signed)
-----   Message from Natalie M Stroud, FNP sent at 02/12/2018 11:18 PM EDT ----- Regarding: "Labs Only" Vitamin D level is decreased. Please schedule patient to come in to have Vitamin D level drawn one day next week.   He should take OTC Vitamin D supplement until, when results are back will further advise.    Thanks! 

## 2018-02-18 NOTE — Telephone Encounter (Signed)
Letter will be mailed out as I have been unable to reach patient by phone after 3 attempts.

## 2018-02-23 MED FILL — PRAVASTATIN NA 40 MG TAB: 40 | 30 days supply | Qty: 30 | Fill #2

## 2018-05-08 MED FILL — PRAVASTATIN NA 40 MG TAB: 40 | 30 days supply | Qty: 30 | Fill #3

## 2018-05-08 MED FILL — AMLODIPINE BESYLATE 5 MG TA: 5 | 30 days supply | Qty: 30 | Fill #3

## 2018-05-08 MED FILL — CARVEDILOL 12.5 MG TABLET: 12.5 | 30 days supply | Qty: 30 | Fill #1

## 2018-06-22 ENCOUNTER — Telehealth: Payer: Self-pay

## 2018-06-22 MED ORDER — AZELASTINE HCL 0.1 % NA SOLN: 2.0000 | Freq: Every day | NASAL | 3 refills | Status: DC

## 2018-06-22 MED FILL — AZELASTINE HCL 137 MCG SPRY: 0.1 | 30 days supply | Qty: 30 | Fill #0

## 2018-06-22 NOTE — Telephone Encounter (Signed)
Refill astelin sent into pharmacy. Thanks!

## 2018-07-17 ENCOUNTER — Ambulatory Visit (INDEPENDENT_AMBULATORY_CARE_PROVIDER_SITE_OTHER): Payer: Self-pay | Admitting: Family Medicine

## 2018-07-17 ENCOUNTER — Encounter: Payer: Self-pay | Admitting: Family Medicine

## 2018-07-17 VITALS — BP 132/74 | HR 84 | Temp 98.1°F | Ht 67.5 in | Wt 102.2 lb

## 2018-07-17 DIAGNOSIS — N529 Male erectile dysfunction, unspecified: Secondary | ICD-10-CM

## 2018-07-17 DIAGNOSIS — I10 Essential (primary) hypertension: Secondary | ICD-10-CM

## 2018-07-17 DIAGNOSIS — E559 Vitamin D deficiency, unspecified: Secondary | ICD-10-CM

## 2018-07-17 DIAGNOSIS — Z716 Tobacco abuse counseling: Secondary | ICD-10-CM

## 2018-07-17 DIAGNOSIS — E785 Hyperlipidemia, unspecified: Secondary | ICD-10-CM

## 2018-07-17 DIAGNOSIS — Z131 Encounter for screening for diabetes mellitus: Secondary | ICD-10-CM

## 2018-07-17 DIAGNOSIS — Z09 Encounter for follow-up examination after completed treatment for conditions other than malignant neoplasm: Secondary | ICD-10-CM

## 2018-07-17 LAB — POCT GLYCOSYLATED HEMOGLOBIN (HGB A1C): Hemoglobin A1C: 5.4 % (ref 4.0–5.6)

## 2018-07-17 NOTE — Progress Notes (Signed)
Follow Up  Subjective:    Patient ID: George Austin, male    DOB: 12/29/1955, 62 y.o.   MRN: [id number]   Chief Complaint  Patient presents with  . Follow-up    chronic condition    HPI  George Austin is a year old male with a past medical history of Hypertension. He is here today for follow.  Current Status: Since his last office visit, he is doing well with no complaints. He continues to smoke 1 pack of cigarettes over a course of 3 days. He denies visual changes, chest pain, cough, shortness of breath, heart palpitations, and falls. He has occasionally headaches and dizziness with position changes. Denies severe headaches, confusion, seizures, double vision, and blurred vision, nausea and vomiting.  He denies fevers, chills, fatigue, recent infections, weight loss, and night sweats. No reports of GI problems such as diarrhea, and constipation. He has no reports of blood in stools, dysuria and hematuria. No depression or anxiety reported. He denies pain today.   Past Medical History:  Diagnosis Date  . Hypertension     Family History  Problem Relation Age of Onset  . Diabetes Mother   . Hypertension Father   . Diabetes Brother     Social History   Socioeconomic History  . Marital status: Single    Spouse name: Not on file  . Number of children: Not on file  . Years of education: Not on file  . Highest education level: Not on file  Occupational History  . Not on file  Social Needs  . Financial resource strain: Not on file  . Food insecurity:    Worry: Not on file    Inability: Not on file  . Transportation needs:    Medical: Not on file    Non-medical: Not on file  Tobacco Use  . Smoking status: Current Every Day Smoker    Packs/day: 0.50    Years: 30.00    Pack years: 15.00    Types: Cigarettes  . Smokeless tobacco: Never Used  . Tobacco comment: smoking .25 - 0.33 ppd  Substance and Sexual Activity  . Alcohol use: Yes    Alcohol/week: 0.0 standard drinks  .  Drug use: No  . Sexual activity: Not on file  Lifestyle  . Physical activity:    Days per week: Not on file    Minutes per session: Not on file  . Stress: Not on file  Relationships  . Social connections:    Talks on phone: Not on file    Gets together: Not on file    Attends religious service: Not on file    Active member of club or organization: Not on file    Attends meetings of clubs or organizations: Not on file    Relationship status: Not on file  . Intimate partner violence:    Fear of current or ex partner: Not on file    Emotionally abused: Not on file    Physically abused: Not on file    Forced sexual activity: Not on file  Other Topics Concern  . Not on file  Social History Narrative  . Not on file    No past surgical history on file.   There is no immunization history on file for this patient.  Current Meds  Medication Sig  . amLODipine (NORVASC) 5 MG tablet Take 1 tablet (5 mg total) by mouth daily.  Marland Kitchen. azelastine (ASTELIN) 0.1 % nasal spray Place 2 sprays into both nostrils  daily. Use in each nostril as directed  . carvedilol (COREG) 12.5 MG tablet Take 1 tablet (12.5 mg total) by mouth daily.  . pravastatin (PRAVACHOL) 40 MG tablet Take 1 tablet (40 mg total) by mouth daily.  . sildenafil (VIAGRA) 25 MG tablet Take 1 tablet (25 mg total) by mouth daily as needed for erectile dysfunction.  . Vitamin D, Ergocalciferol, (DRISDOL) 50000 units CAPS capsule Take 1 capsule (50,000 Units total) by mouth every 7 (seven) days.     There is no immunization history on file for this patient.  No Known Allergies  BP 132/74 (BP Location: Left Arm, Patient Position: Sitting, Cuff Size: Small)   Pulse 84   Temp 98.1 F (36.7 C) (Oral)   Ht 5' 7.5" (1.715 m)   Wt 102 lb 3.2 oz (46.4 kg)   SpO2 98%   BMI 15.77 kg/m   Review of Systems  Constitutional: Negative.   HENT: Negative.   Eyes: Negative.   Respiratory: Negative.   Cardiovascular: Negative.    Gastrointestinal: Negative.   Endocrine: Negative.   Genitourinary: Negative.   Musculoskeletal: Negative.   Skin: Negative.   Allergic/Immunologic: Negative.   Neurological: Positive for dizziness and headaches.  Hematological: Negative.   Psychiatric/Behavioral: Negative.    Objective:   Physical Exam  Constitutional: He is oriented to person, place, and time. He appears well-developed and well-nourished.  HENT:  Head: Normocephalic and atraumatic.  Eyes: Pupils are equal, round, and reactive to light. Conjunctivae and EOM are normal.  Neck: Normal range of motion. Neck supple.  Cardiovascular: Normal rate, regular rhythm, normal heart sounds and intact distal pulses.  Pulmonary/Chest: Effort normal and breath sounds normal.  Abdominal: Soft. Bowel sounds are normal.  Musculoskeletal: Normal range of motion.  Neurological: He is alert and oriented to person, place, and time.  Skin: Skin is warm and dry.  Psychiatric: He has a normal mood and affect. His behavior is normal. Judgment and thought content normal.  Nursing note and vitals reviewed.  Assessment & Plan:   1. Essential hypertension Antihypertensive medications are effective. Blood pressure is 132/74 today. Continue Carvedilol and Amlodipine as prescribed. He will continue to decrease high sodium intake, excessive alcohol intake, increase potassium intake, smoking cessation, and increase physical activity of at least 30 minutes of cardio activity daily. He will continue to follow Heart Healthy or DASH diet.  2. Hyperlipidemia, unspecified hyperlipidemia type  3. Erectile dysfunction, unspecified erectile dysfunction type  4. Vitamin D deficiency Continue Vitamin D supplement as prescribed.   5. Tobacco abuse counseling  6. Encounter for smoking cessation counseling  7. Screening for diabetes mellitus Hgb A1c is within normal range of 5.4 today. He will continue to decrease foods/beverages high in sugars and  carbs and follow Heart Healthy or DASH diet. Increase physical activity to at least 30 minutes cardio exercise daily.   - POCT glycosylated hemoglobin (Hb A1C) - POCT urinalysis dipstick  8. Follow up He will follow up in 6 months.   No orders of the defined types were placed in this encounter.  Raliegh Ip,  MSN, FNP-C Patient Care [facility] [address] [place], [place] [address] [phone]

## 2018-08-06 MED FILL — CARVEDILOL 12.5 MG TABLET: 12.5 | 30 days supply | Qty: 30 | Fill #2

## 2018-08-06 MED FILL — PRAVASTATIN NA 40 MG TAB: 40 | 30 days supply | Qty: 30 | Fill #4

## 2018-11-10 ENCOUNTER — Other Ambulatory Visit: Payer: Self-pay | Admitting: Family Medicine

## 2018-11-10 DIAGNOSIS — E785 Hyperlipidemia, unspecified: Secondary | ICD-10-CM

## 2018-11-10 DIAGNOSIS — I10 Essential (primary) hypertension: Secondary | ICD-10-CM

## 2018-11-10 MED ORDER — AMLODIPINE BESYLATE 5 MG PO TABS: 5.0000 mg | ORAL_TABLET | Freq: Every day | ORAL | 6 refills | Status: DC

## 2018-11-10 MED ORDER — PRAVASTATIN SODIUM 40 MG PO TABS: 40.0000 mg | ORAL_TABLET | Freq: Every day | ORAL | 6 refills | Status: DC

## 2018-11-10 MED FILL — CARVEDILOL 12.5 MG TABLET: 12.5 | 30 days supply | Qty: 30 | Fill #3

## 2018-11-11 ENCOUNTER — Telehealth: Payer: Self-pay

## 2018-11-11 MED FILL — PRAVASTATIN NA 40 MG TAB: 40 | 30 days supply | Qty: 30 | Fill #0

## 2018-11-11 MED FILL — AMLODIPINE BESYLATE 5 MG TA: 5 | 30 days supply | Qty: 30 | Fill #0

## 2018-11-11 NOTE — Telephone Encounter (Signed)
Patient notified

## 2018-11-11 NOTE — Telephone Encounter (Signed)
-----   Message from Kallie Locks, FNP sent at 11/10/2018  8:52 PM EDT ----- Regarding: "Refills" Rxs for Atorvastatin and Amlodipine sent to pharmacy today. Please inform patient.

## 2019-01-14 ENCOUNTER — Telehealth: Payer: Self-pay

## 2019-01-14 NOTE — Telephone Encounter (Signed)
Left a vm for patient to callback and do the screening for appointment tomorrow.

## 2019-01-15 ENCOUNTER — Ambulatory Visit: Payer: Self-pay | Admitting: Family Medicine

## 2019-01-15 ENCOUNTER — Other Ambulatory Visit: Payer: Self-pay

## 2019-01-19 ENCOUNTER — Telehealth: Payer: Self-pay

## 2019-01-19 NOTE — Telephone Encounter (Signed)
Left a vm for patient letting him know that we have him down for a telephone visit tomorrow with provider

## 2019-01-20 ENCOUNTER — Other Ambulatory Visit: Payer: Self-pay

## 2019-01-20 ENCOUNTER — Ambulatory Visit (INDEPENDENT_AMBULATORY_CARE_PROVIDER_SITE_OTHER): Payer: Self-pay | Admitting: Family Medicine

## 2019-01-20 DIAGNOSIS — N529 Male erectile dysfunction, unspecified: Secondary | ICD-10-CM

## 2019-01-20 DIAGNOSIS — Z09 Encounter for follow-up examination after completed treatment for conditions other than malignant neoplasm: Secondary | ICD-10-CM

## 2019-01-20 DIAGNOSIS — IMO0001 Reserved for inherently not codable concepts without codable children: Secondary | ICD-10-CM

## 2019-01-20 DIAGNOSIS — E559 Vitamin D deficiency, unspecified: Secondary | ICD-10-CM

## 2019-01-20 DIAGNOSIS — I10 Essential (primary) hypertension: Secondary | ICD-10-CM

## 2019-01-20 DIAGNOSIS — F17200 Nicotine dependence, unspecified, uncomplicated: Secondary | ICD-10-CM

## 2019-01-20 DIAGNOSIS — E785 Hyperlipidemia, unspecified: Secondary | ICD-10-CM

## 2019-01-20 DIAGNOSIS — Z716 Tobacco abuse counseling: Secondary | ICD-10-CM

## 2019-01-20 MED ORDER — SILDENAFIL CITRATE 25 MG PO TABS: 25.0000 mg | ORAL_TABLET | Freq: Every day | ORAL | 6 refills | Status: DC | PRN

## 2019-01-20 MED ORDER — CARVEDILOL 12.5 MG PO TABS: 12.5000 mg | ORAL_TABLET | Freq: Every day | ORAL | 6 refills | Status: DC

## 2019-01-20 MED FILL — ?CARVEDILOL 12.5MG TABLET: 12.5 | 30 days supply | Qty: 30 | Fill #0

## 2019-01-20 MED FILL — SILDENAFIL 25 MG TABLET: 25 | 30 days supply | Qty: 10 | Fill #0

## 2019-01-20 NOTE — Progress Notes (Signed)
Virtual Visit via Telephone Note  I connected with George Austin on 01/20/19 at  1:40 PM EDT by telephone and verified that I am speaking with the correct person using two identifiers.   I discussed the limitations, risks, security and privacy concerns of performing an evaluation and management service by telephone and the availability of in person appointments. I also discussed with the patient that there may be a patient responsible charge related to this service. The patient expressed understanding and agreed to proceed.   History of Present Illness:  Past Medical History:  Diagnosis Date  . Hypertension     Current Outpatient Medications on File Prior to Visit  Medication Sig Dispense Refill  . amLODipine (NORVASC) 5 MG tablet Take 1 tablet (5 mg total) by mouth daily. 30 tablet 6  . azelastine (ASTELIN) 0.1 % nasal spray Place 2 sprays into both nostrils daily. Use in each nostril as directed 30 mL 3  . pravastatin (PRAVACHOL) 40 MG tablet Take 1 tablet (40 mg total) by mouth daily. 30 tablet 6  . Vitamin D, Ergocalciferol, (DRISDOL) 50000 units CAPS capsule Take 1 capsule (50,000 Units total) by mouth every 7 (seven) days. 30 capsule 2   No current facility-administered medications on file prior to visit.     Current Status: Since his last office visit, he is doing well with no complaints. He denies visual changes, chest pain, cough, shortness of breath, heart palpitations, and falls. He has occasional headaches and dizziness with position changes. Denies severe headaches, confusion, seizures, double vision, and blurred vision, nausea and vomiting. He continues to smoke 1 pack of cigarettes over a period of 4 days.   He denies fevers, chills, fatigue, recent infections, weight loss, and night sweats. No reports of GI problems such as diarrhea, and constipation. He has no reports of blood in stools, dysuria and hematuria. No depression or anxiety reported. He denies pain today.    Observations/Objective:  Telephone Virtual Visit   Assessment and Plan:  1. Essential hypertension He will continue to decrease high sodium intake, excessive alcohol intake, increase potassium intake, smoking cessation, and increase physical activity of at least 30 minutes of cardio activity daily. He will continue to follow Heart Healthy or DASH diet. - carvedilol (COREG) 12.5 MG tablet; Take 1 tablet (12.5 mg total) by mouth daily.  Dispense: 30 tablet; Refill: 6  2. Hyperlipidemia, unspecified hyperlipidemia type  3. Erectile dysfunction, unspecified erectile dysfunction type - sildenafil (VIAGRA) 25 MG tablet; Take 1 tablet (25 mg total) by mouth daily as needed for erectile dysfunction.  Dispense: 10 tablet; Refill: 6  4. Smoking  5. Encounter for smoking cessation counseling  6. Vitamin D deficiency   Meds ordered this encounter  Medications  . sildenafil (VIAGRA) 25 MG tablet    Sig: Take 1 tablet (25 mg total) by mouth daily as needed for erectile dysfunction.    Dispense:  10 tablet    Refill:  6  . carvedilol (COREG) 12.5 MG tablet    Sig: Take 1 tablet (12.5 mg total) by mouth daily.    Dispense:  30 tablet    Refill:  6   No orders of the defined types were placed in this encounter.   Referral Orders  No referral(s) requested today    Raliegh IpNatalie Julyssa Kyer,  MSN, FNP-BC Patient Care Center [facility] [address]  , [place] [id number] [phone]   Follow Up Instructions:  He will follow up in 6 months.  I discussed the assessment and treatment plan with the patient. The patient was provided an opportunity to ask questions and all were answered. The patient agreed with the plan and demonstrated an understanding of the instructions.   The patient was advised to call back or seek an in-person evaluation if the symptoms worsen or if the condition fails to improve as anticipated.  I provided 15 minutes of non-face-to-face time  during this encounter.   Kallie Locks, FNP

## 2019-01-21 DIAGNOSIS — E559 Vitamin D deficiency, unspecified: Secondary | ICD-10-CM | POA: Insufficient documentation

## 2019-03-10 MED FILL — PRAVASTATIN NA 40 MG TAB: 40 | 30 days supply | Qty: 30 | Fill #1

## 2019-05-18 MED FILL — PRAVASTATIN NA 40 MG TAB: 40 | 30 days supply | Qty: 30 | Fill #2

## 2019-05-18 MED FILL — ?AMLODIPINE BESYLATE 5MG TA: 5 | 30 days supply | Qty: 30 | Fill #1

## 2019-05-18 MED FILL — ?CARVEDILOL 12.5 MG TABLET: 12.5 | 30 days supply | Qty: 30 | Fill #1

## 2019-07-26 ENCOUNTER — Ambulatory Visit: Payer: Self-pay | Admitting: Family Medicine

## 2019-07-27 DIAGNOSIS — R7989 Other specified abnormal findings of blood chemistry: Secondary | ICD-10-CM

## 2019-07-27 DIAGNOSIS — E559 Vitamin D deficiency, unspecified: Secondary | ICD-10-CM

## 2019-07-27 DIAGNOSIS — E785 Hyperlipidemia, unspecified: Secondary | ICD-10-CM

## 2019-07-27 HISTORY — DX: Vitamin D deficiency, unspecified: E55.9

## 2019-07-27 HISTORY — DX: Other specified abnormal findings of blood chemistry: R79.89

## 2019-07-27 HISTORY — DX: Hyperlipidemia, unspecified: E78.5

## 2019-07-28 ENCOUNTER — Ambulatory Visit (INDEPENDENT_AMBULATORY_CARE_PROVIDER_SITE_OTHER): Payer: Self-pay | Admitting: Family Medicine

## 2019-07-28 ENCOUNTER — Other Ambulatory Visit: Payer: Self-pay

## 2019-07-28 ENCOUNTER — Encounter: Payer: Self-pay | Admitting: Gastroenterology

## 2019-07-28 ENCOUNTER — Encounter: Payer: Self-pay | Admitting: Family Medicine

## 2019-07-28 VITALS — BP 114/67 | HR 80 | Temp 97.7°F | Ht 67.5 in | Wt 110.6 lb

## 2019-07-28 DIAGNOSIS — I10 Essential (primary) hypertension: Secondary | ICD-10-CM

## 2019-07-28 DIAGNOSIS — J302 Other seasonal allergic rhinitis: Secondary | ICD-10-CM | POA: Insufficient documentation

## 2019-07-28 DIAGNOSIS — Z0000 Encounter for general adult medical examination without abnormal findings: Secondary | ICD-10-CM

## 2019-07-28 DIAGNOSIS — Z09 Encounter for follow-up examination after completed treatment for conditions other than malignant neoplasm: Secondary | ICD-10-CM

## 2019-07-28 DIAGNOSIS — Z2821 Immunization not carried out because of patient refusal: Secondary | ICD-10-CM

## 2019-07-28 DIAGNOSIS — Z1211 Encounter for screening for malignant neoplasm of colon: Secondary | ICD-10-CM

## 2019-07-28 DIAGNOSIS — E785 Hyperlipidemia, unspecified: Secondary | ICD-10-CM | POA: Insufficient documentation

## 2019-07-28 DIAGNOSIS — E559 Vitamin D deficiency, unspecified: Secondary | ICD-10-CM

## 2019-07-28 LAB — POCT URINALYSIS DIPSTICK
Blood, UA: NEGATIVE
Glucose, UA: NEGATIVE
Ketones, UA: 15
Leukocytes, UA: NEGATIVE
Nitrite, UA: NEGATIVE
Protein, UA: POSITIVE — AB
Spec Grav, UA: >=1.03 — AB (ref 1.010–1.025)
Urobilinogen, UA: 0.2 E.U./dL
pH, UA: 5.5 (ref 5.0–8.0)

## 2019-07-28 LAB — POCT GLYCOSYLATED HEMOGLOBIN (HGB A1C): Hemoglobin A1C: 5 % (ref 4.0–5.6)

## 2019-07-28 LAB — GLUCOSE, POCT (MANUAL RESULT ENTRY): POC Glucose: 63 mg/dl — AB (ref 70–99)

## 2019-07-28 MED ORDER — FLUTICASONE PROPIONATE 50 MCG/ACT NA SUSP: 2.0000 | Freq: Every day | NASAL | 6 refills | Status: DC

## 2019-07-28 MED FILL — FLUTICASONE PROP 50 MCG SPR: 50 | 30 days supply | Qty: 16 | Fill #0

## 2019-07-28 NOTE — Patient Instructions (Signed)
Fluticasone nasal spray What is this medicine? FLUTICASONE (floo TIK a sone) is a corticosteroid. This medicine is used to treat the symptoms of allergies like sneezing, itchy red eyes, and itchy, runny, or stuffy nose. This medicine is also used to treat nasal polyps. This medicine may be used for other purposes; ask your health care provider or pharmacist if you have questions. COMMON BRAND NAME(S): ClariSpray, Flonase, Flonase Allergy Relief, Flonase Sensimist, Veramyst, XHANCE What should I tell my health care provider before I take this medicine? They need to know if you have any of these conditions:  eye disease, vision problems  infection, like tuberculosis, herpes, or fungal infection  recent surgery on nose or sinuses  taking a corticosteroid by mouth  an unusual or allergic reaction to fluticasone, steroids, other medicines, foods, dyes, or preservatives  pregnant or trying to get pregnant  breast-feeding How should I use this medicine? This medicine is for use in the nose. Follow the directions on your product or prescription label. This medicine works best if used at regular intervals. Do not use more often than directed. Make sure that you are using your nasal spray correctly. After 6 months of daily use for allergies, talk to your doctor or health care professional before using it for a longer time. Ask your doctor or health care professional if you have any questions. Talk to your pediatrician regarding the use of this medicine in children. Special care may be needed. Some products have been used for allergies in children as young as 2 years. After 2 months of daily use without a prescription in a child, talk to your pediatrician before using it for a longer time. Use of this medicine for nasal polyps is not approved in children. Overdosage: If you think you have taken too much of this medicine contact a poison control center or emergency room at once. NOTE: This medicine is  only for you. Do not share this medicine with others. What if I miss a dose? If you miss a dose, use it as soon as you remember. If it is almost time for your next dose, use only that dose and continue with your regular schedule. Do not use double or extra doses. What may interact with this medicine?  certain antibiotics like clarithromycin and telithromycin  certain medicines for fungal infections like ketoconazole, itraconazole, and voriconazole  conivaptan  nefazodone  some medicines for HIV  vaccines This list may not describe all possible interactions. Give your health care provider a list of all the medicines, herbs, non-prescription drugs, or dietary supplements you use. Also tell them if you smoke, drink alcohol, or use illegal drugs. Some items may interact with your medicine. What should I watch for while using this medicine? Visit your healthcare professional for regular checks on your progress. Tell your healthcare professional if your symptoms do not start to get better or if they get worse. This medicine may increase your risk of getting an infection. Tell your doctor or health care professional if you are around anyone with measles or chickenpox, or if you develop sores or blisters that do not heal properly. What side effects may I notice from receiving this medicine? Side effects that you should report to your doctor or health care professional as soon as possible:  allergic reactions like skin rash, itching or hives, swelling of the face, lips, or tongue  changes in vision  crusting or sores in the nose  nosebleed  signs and symptoms of infection like  fever or chills; cough; sore throat  white patches or sores in the mouth or nose Side effects that usually do not require medical attention (report to your doctor or health care professional if they continue or are bothersome):  burning or irritation inside the nose or throat  changes in taste or smell  cough   headache This list may not describe all possible side effects. Call your doctor for medical advice about side effects. You may report side effects to FDA at 1-800-FDA-1088. Where should I keep my medicine? Keep out of the reach of children. Store at room temperature between 15 and 30 degrees C (59 and 86 degrees F). Avoid exposure to extreme heat, cold, or light. Throw away any unused medicine after the expiration date. NOTE: This sheet is a summary. It may not cover all possible information. If you have questions about this medicine, talk to your doctor, pharmacist, or health care provider.  2020 Elsevier/Gold Standard (2017-09-04 14:10:08) Allergies, Adult An allergy is when your body's defense system (immune system) overreacts to an otherwise harmless substance (allergen) that you breathe in or eat or something that touches your skin. When you come into contact with something that you are allergic to, your immune system produces certain proteins (antibodies). These proteins cause cells to release chemicals (histamines) that trigger the symptoms of an allergic reaction. Allergies often affect the nasal passages (allergic rhinitis), eyes (allergic conjunctivitis), skin (atopic dermatitis), and stomach. Allergies can be mild or severe. Allergies cannot spread from person to person (are not contagious). They can develop at any age and may be outgrown. What increases the risk? You may be at greater risk of allergies if other people in your family have allergies. What are the signs or symptoms? Symptoms depend on what type of allergy you have. They may include:  Runny, stuffy nose.  Sneezing.  Itchy mouth, ears, or throat.  Postnasal drip.  Sore throat.  Itchy, red, watery, or puffy eyes.  Skin rash or hives.  Stomach pain.  Vomiting.  Diarrhea.  Bloating.  Wheezing or coughing. People with a severe allergy to food, medicine, or an insect bite may have a life-threatening allergic  reaction (anaphylaxis). Symptoms of anaphylaxis include:  Hives.  Itching.  Flushed face.  Swollen lips, tongue, or mouth.  Tight or swollen throat.  Chest pain or tightness in the chest.  Trouble breathing or shortness of breath.  Rapid heartbeat.  Dizziness or fainting.  Vomiting.  Diarrhea.  Pain in the abdomen. How is this diagnosed? This condition is diagnosed based on:  Your symptoms.  Your family and medical history.  A physical exam. You may need to see a health care provider who specializes in treating allergies (allergist). You may also have tests, including:  Skin tests to see which allergens are causing your symptoms, such as: ? Skin prick test. In this test, your skin is pricked with a tiny needle and exposed to small amounts of possible allergens to see if your skin reacts. ? Intradermal skin test. In this test, a small amount of allergen is injected under your skin to see if your skin reacts. ? Patch test. In this test, a small amount of allergen is placed on your skin and then your skin is covered with a bandage. Your health care provider will check your skin after a couple of days to see if a rash has developed.  Blood tests.  Challenges tests. In this test, you inhale a small amount of allergen by mouth  to see if you have an allergic reaction. You may also be asked to:  Keep a food diary. A food diary is a record of all the foods and drinks you have in a day and any symptoms you experience.  Practice an elimination diet. An elimination diet involves eliminating specific foods from your diet and then adding them back in one by one to find out if a certain food causes an allergic reaction. How is this treated? Treatment for allergies depends on your symptoms. Treatment may include:  Cold compresses to soothe itching and swelling.  Eye drops.  Nasal sprays.  Using a saline spray or container (neti pot) to flush out the nose (nasal irrigation).  These methods can help clear away mucus and keep the nasal passages moist.  Using a humidifier.  Oral antihistamines or other medicines to block allergic reaction and inflammation.  Skin creams to treat rashes or itching.  Diet changes to eliminate food allergy triggers.  Repeated exposure to tiny amounts of allergens to build up a tolerance and prevent future allergic reactions (immunotherapy). These include: ? Allergy shots. ? Oral treatment. This involves taking small doses of an allergen under the tongue (sublingual immunotherapy).  Emergency epinephrine injection (auto-injector) in case of an allergic emergency. This is a self-injectable, pre-measured medicine that must be given within the first few minutes of a serious allergic reaction. Follow these instructions at home:         Avoid known allergens whenever possible.  If you suffer from airborne allergens, wash out your nose daily. You can do this with a saline spray or a neti pot to flush out your nose (nasal irrigation).  Take over-the-counter and prescription medicines only as told by your health care provider.  Keep all follow-up visits as told by your health care provider. This is important.  If you are at risk of a severe allergic reaction (anaphylaxis), keep your auto-injector with you at all times.  If you have ever had anaphylaxis, wear a medical alert bracelet or necklace that states you have a severe allergy. Contact a health care provider if:  Your symptoms do not improve with treatment. Get help right away if:  You have symptoms of anaphylaxis, such as: ? Swollen mouth, tongue, or throat. ? Pain or tightness in your chest. ? Trouble breathing or shortness of breath. ? Dizziness or fainting. ? Severe abdominal pain, vomiting, or diarrhea. This information is not intended to replace advice given to you by your health care provider. Make sure you discuss any questions you have with your health care  provider. Document Released: 11/05/2002 Document Revised: 11/05/2017 Document Reviewed: 02/28/2016 Elsevier Patient Education  2020 [employer].

## 2019-07-28 NOTE — Progress Notes (Signed)
[facility]   Established Patient Office Visit  Subjective:  Patient ID: George Austin, male    DOB: 12/02/1955  Age: 63 y.o. MRN: [id number]  CC:  Chief Complaint  Patient presents with  . Follow-up    HTN  . Fatigue    onset 3 weeks    HPI George Austin is a 63 year old male who presents for Follow Up today.   Past Medical History:  Diagnosis Date  . Hypertension    Current Status: Since his last office visit, he is doing well with no complaints.  He denies visual changes, chest pain, cough, shortness of breath, heart palpitations, and falls. He has occasional headaches and dizziness with position changes. Denies severe headaches, confusion, seizures, double vision, and blurred vision, nausea and vomiting.He states that nasal spray is not effective. He denies fevers, chills, fatigue, recent infections, weight loss, and night sweats.  No reports of GI problems such as diarrhea, and constipation. He has no reports of blood in stools, dysuria and hematuria. No depression or anxiety reported today. He denies suicidal ideations, homicidal ideations, or auditory hallucinations. He denies pain today.   History reviewed. No pertinent surgical history.  Family History  Problem Relation Age of Onset  . Diabetes Mother   . Hypertension Father   . Diabetes Brother     Social History   Socioeconomic History  . Marital status: Single    Spouse name: Not on file  . Number of children: Not on file  . Years of education: Not on file  . Highest education level: Not on file  Occupational History  . Not on file  Social Needs  . Financial resource strain: Not on file  . Food insecurity    Worry: Not on file    Inability: Not on file  . Transportation needs    Medical: Not on file    Non-medical: Not on file  Tobacco Use  . Smoking status: Current Every Day Smoker    Packs/day: 0.50    Years: 30.00    Pack years: 15.00   Types: Cigarettes  . Smokeless tobacco: Never Used  . Tobacco comment: smoking .25 - 0.33 ppd  Substance and Sexual Activity  . Alcohol use: Yes    Alcohol/week: 0.0 standard drinks  . Drug use: No  . Sexual activity: Not Currently  Lifestyle  . Physical activity    Days per week: Not on file    Minutes per session: Not on file  . Stress: Not on file  Relationships  . Social Herbalist on phone: Not on file    Gets together: Not on file    Attends religious service: Not on file    Active member of club or organization: Not on file    Attends meetings of clubs or organizations: Not on file    Relationship status: Not on file  . Intimate partner violence    Fear of current or ex partner: Not on file    Emotionally abused: Not on file    Physically abused: Not on file    Forced sexual activity: Not on file  Other Topics Concern  . Not on file  Social History Narrative  . Not on file    Outpatient Medications Prior to Visit  Medication Sig Dispense Refill  . amLODipine (NORVASC) 5 MG tablet Take 1 tablet (5 mg total) by mouth daily. 30 tablet 6  .  azelastine (ASTELIN) 0.1 % nasal spray Place 2 sprays into both nostrils daily. Use in each nostril as directed 30 mL 3  . carvedilol (COREG) 12.5 MG tablet Take 1 tablet (12.5 mg total) by mouth daily. 30 tablet 6  . pravastatin (PRAVACHOL) 40 MG tablet Take 1 tablet (40 mg total) by mouth daily. 30 tablet 6  . sildenafil (VIAGRA) 25 MG tablet Take 1 tablet (25 mg total) by mouth daily as needed for erectile dysfunction. 10 tablet 6  . Vitamin D, Ergocalciferol, (DRISDOL) 50000 units CAPS capsule Take 1 capsule (50,000 Units total) by mouth every 7 (seven) days. 30 capsule 2   No facility-administered medications prior to visit.     No Known Allergies  ROS Review of Systems  Constitutional: Negative.   HENT: Negative.   Eyes: Negative.   Respiratory: Negative.   Cardiovascular: Negative.   Gastrointestinal:  Negative.   Endocrine: Negative.   Genitourinary: Negative.   Musculoskeletal: Negative.   Skin: Negative.   Allergic/Immunologic: Negative.   Neurological: Negative.   Hematological: Negative.   Psychiatric/Behavioral: Negative.       Objective:    Physical Exam  Constitutional: He is oriented to person, place, and time. He appears well-developed and well-nourished.  HENT:  Head: Normocephalic and atraumatic.  Eyes: Conjunctivae are normal.  Neck: Normal range of motion. Neck supple.  Cardiovascular: Normal rate, regular rhythm, normal heart sounds and intact distal pulses.  Pulmonary/Chest: Effort normal and breath sounds normal.  Abdominal: Soft. Bowel sounds are normal.  Musculoskeletal: Normal range of motion.  Neurological: He is alert and oriented to person, place, and time. He has normal reflexes.  Skin: Skin is warm and dry.  Psychiatric: He has a normal mood and affect. His behavior is normal. Judgment and thought content normal.  Nursing note and vitals reviewed.   BP 114/67   Pulse 80   Temp 97.7 F (36.5 C) (Oral)   Ht 5' 7.5" (1.715 m)   Wt 110 lb 9.6 oz (50.2 kg)   SpO2 97%   BMI 17.07 kg/m  Wt Readings from Last 3 Encounters:  07/28/19 110 lb 9.6 oz (50.2 kg)  07/17/18 102 lb 3.2 oz (46.4 kg)  01/14/18 108 lb (49 kg)     Health Maintenance Due  Topic Date Due  . Hepatitis C Screening  27-Feb-1956  . COLONOSCOPY  03/30/2008  . INFLUENZA VACCINE  03/27/2019    There are no preventive care reminders to display for this patient.  Lab Results  Component Value Date   TSH 0.73 07/08/2017   Lab Results  Component Value Date   WBC 6.1 01/14/2018   HGB 13.3 01/14/2018   HCT 39.1 01/14/2018   MCV 96 01/14/2018   PLT 251 01/14/2018   Lab Results  Component Value Date   NA 140 01/14/2018   K 4.3 01/14/2018   CO2 25 01/14/2018   GLUCOSE 113 (H) 01/14/2018   BUN 15 01/14/2018   CREATININE 1.47 (H) 01/14/2018   BILITOT 0.3 01/14/2018    ALKPHOS 76 01/14/2018   AST 63 (H) 01/14/2018   ALT 29 01/14/2018   PROT 7.4 01/14/2018   ALBUMIN 4.7 01/14/2018   CALCIUM 9.8 01/14/2018   Lab Results  Component Value Date   CHOL 198 01/14/2018   Lab Results  Component Value Date   HDL 98 01/14/2018   Lab Results  Component Value Date   LDLCALC 85 01/14/2018   Lab Results  Component Value Date   TRIG 76 01/14/2018  Lab Results  Component Value Date   CHOLHDL 2.0 01/14/2018   Lab Results  Component Value Date   HGBA1C 5.0 07/28/2019      Assessment & Plan:   1. Essential hypertension The current medical regimen is effective; blood pressure is stable at 114/67 today; continue present plan and medications as prescribed. He will continue to take medications as prescribed, to decrease high sodium intake, excessive alcohol intake, increase potassium intake, smoking cessation, and increase physical activity of at least 30 minutes of cardio activity daily. He will continue to follow Heart Healthy or DASH diet. - Urinalysis Dipstick - CBC with Differential - Comp Met (CMET) - Lipid Panel - TSH - PSA - Vitamin B12 - Vitamin D, 25-hydroxy  2. Hyperlipidemia, unspecified hyperlipidemia type  3. Screening for colon cancer We will send referral for Colonoscopy today.   4. Vitamin D deficiency  5. Healthcare maintenance - POCT HgB A1C - Glucose (CBG)  6. Seasonal allergies We will initiate nasal steroid today.  - fluticasone (FLONASE) 50 MCG/ACT nasal spray; Place 2 sprays into both nostrils daily.  Dispense: 16 g; Refill: 6  7. Influenza vaccination declined  8. Follow up He will follow up in 6 months.   Meds ordered this encounter  Medications  . fluticasone (FLONASE) 50 MCG/ACT nasal spray    Sig: Place 2 sprays into both nostrils daily.    Dispense:  16 g    Refill:  6    Orders Placed This Encounter  Procedures  . CBC with Differential  . Comp Met (CMET)  . Lipid Panel  . TSH  . PSA  . Vitamin  B12  . Vitamin D, 25-hydroxy  . POCT HgB A1C  . Urinalysis Dipstick  . Glucose (CBG)    Referral Orders  No referral(s) requested today    Kathe Becton,  MSN, FNP-BC [place], [place] [address] [phone] fax    Problem List Items Addressed This Visit      Cardiovascular and Mediastinum   HTN (hypertension) - Primary   Relevant Orders   Urinalysis Dipstick (Completed)   CBC with Differential   Comp Met (CMET)   Lipid Panel   TSH   PSA   Vitamin B12   Vitamin D, 25-hydroxy     Other   Vitamin D deficiency    Other Visit Diagnoses    Hyperlipidemia, unspecified hyperlipidemia type       Screening for colon cancer       Healthcare maintenance       Relevant Orders   POCT HgB A1C (Completed)   Glucose (CBG) (Completed)   Seasonal allergies       Relevant Medications   fluticasone (FLONASE) 50 MCG/ACT nasal spray   Influenza vaccination declined       Follow up          Meds ordered this encounter  Medications  . fluticasone (FLONASE) 50 MCG/ACT nasal spray    Sig: Place 2 sprays into both nostrils daily.    Dispense:  16 g    Refill:  6    Follow-up: Return in about 6 months (around 01/26/2020).    Azzie Glatter, FNP

## 2019-07-29 LAB — COMPREHENSIVE METABOLIC PANEL
ALT: 25 IU/L (ref 0–44)
AST: 57 IU/L — ABNORMAL HIGH (ref 0–40)
Albumin/Globulin Ratio: 1.6 (ref 1.2–2.2)
Albumin: 4.6 g/dL (ref 3.8–4.8)
Alkaline Phosphatase: 104 IU/L (ref 39–117)
BUN/Creatinine Ratio: 14 (ref 10–24)
BUN: 30 mg/dL — ABNORMAL HIGH (ref 8–27)
Bilirubin Total: 0.6 mg/dL (ref 0.0–1.2)
CO2: 17 mmol/L — ABNORMAL LOW (ref 20–29)
Calcium: 9.7 mg/dL (ref 8.6–10.2)
Chloride: 89 mmol/L — ABNORMAL LOW (ref 96–106)
Creatinine, Ser: 2.11 mg/dL — ABNORMAL HIGH (ref 0.76–1.27)
GFR calc Af Amer: 38 mL/min/{1.73_m2} — ABNORMAL LOW (ref >59)
GFR calc non Af Amer: 33 mL/min/{1.73_m2} — ABNORMAL LOW (ref >59)
Globulin, Total: 2.9 g/dL (ref 1.5–4.5)
Glucose: 108 mg/dL — ABNORMAL HIGH (ref 65–99)
Potassium: 5.5 mmol/L — ABNORMAL HIGH (ref 3.5–5.2)
Sodium: 135 mmol/L (ref 134–144)
Total Protein: 7.5 g/dL (ref 6.0–8.5)

## 2019-07-29 LAB — VITAMIN D 25 HYDROXY (VIT D DEFICIENCY, FRACTURES): Vit D, 25-Hydroxy: 11.5 ng/mL — ABNORMAL LOW (ref 30.0–100.0)

## 2019-07-29 LAB — CBC WITH DIFFERENTIAL/PLATELET
Basophils Absolute: 0 10*3/uL (ref 0.0–0.2)
Basos: 1 %
EOS (ABSOLUTE): 0.1 10*3/uL (ref 0.0–0.4)
Eos: 1 %
Hematocrit: 40.3 % (ref 37.5–51.0)
Hemoglobin: 13.1 g/dL (ref 13.0–17.7)
Immature Grans (Abs): 0 10*3/uL (ref 0.0–0.1)
Immature Granulocytes: 0 %
Lymphocytes Absolute: 2.1 10*3/uL (ref 0.7–3.1)
Lymphs: 33 %
MCH: 32.3 pg (ref 26.6–33.0)
MCHC: 32.5 g/dL (ref 31.5–35.7)
MCV: 99 fL — ABNORMAL HIGH (ref 79–97)
Monocytes Absolute: 0.7 10*3/uL (ref 0.1–0.9)
Monocytes: 11 %
Neutrophils Absolute: 3.5 10*3/uL (ref 1.4–7.0)
Neutrophils: 54 %
Platelets: 248 10*3/uL (ref 150–450)
RBC: 4.06 x10E6/uL — ABNORMAL LOW (ref 4.14–5.80)
RDW: 14.9 % (ref 11.6–15.4)
WBC: 6.4 10*3/uL (ref 3.4–10.8)

## 2019-07-29 LAB — LIPID PANEL
Chol/HDL Ratio: 2.9 ratio (ref 0.0–5.0)
Cholesterol, Total: 222 mg/dL — ABNORMAL HIGH (ref 100–199)
HDL: 77 mg/dL (ref >39)
LDL Chol Calc (NIH): 111 mg/dL — ABNORMAL HIGH (ref 0–99)
Triglycerides: 202 mg/dL — ABNORMAL HIGH (ref 0–149)
VLDL Cholesterol Cal: 34 mg/dL (ref 5–40)

## 2019-07-29 LAB — PSA: Prostate Specific Ag, Serum: 4.5 ng/mL — ABNORMAL HIGH (ref 0.0–4.0)

## 2019-07-29 LAB — VITAMIN B12: Vitamin B-12: 281 pg/mL (ref 232–1245)

## 2019-07-29 LAB — TSH: TSH: 0.48 u[IU]/mL (ref 0.450–4.500)

## 2019-07-30 MED FILL — ?CARVEDILOL 12.5 MG TABLET: 12.5 | 30 days supply | Qty: 30 | Fill #2

## 2019-07-30 MED FILL — ?AMLODIPINE BESYLATE 5 MG T: 5 MG | 30 days supply | Qty: 30 | Fill #2

## 2019-07-30 MED FILL — ?PRAVASTATIN NA 40 MG TAB: 40 | 30 days supply | Qty: 30 | Fill #3

## 2019-08-09 ENCOUNTER — Telehealth: Payer: Self-pay | Admitting: Family Medicine

## 2019-08-09 NOTE — Telephone Encounter (Signed)
Message left on patient's voicemail to return call to office to review recent lab results.

## 2019-08-10 ENCOUNTER — Other Ambulatory Visit: Payer: Self-pay | Admitting: Family Medicine

## 2019-08-10 ENCOUNTER — Encounter: Payer: Self-pay | Admitting: Family Medicine

## 2019-08-10 DIAGNOSIS — R7989 Other specified abnormal findings of blood chemistry: Secondary | ICD-10-CM

## 2019-08-10 DIAGNOSIS — E559 Vitamin D deficiency, unspecified: Secondary | ICD-10-CM

## 2019-08-10 DIAGNOSIS — E782 Mixed hyperlipidemia: Secondary | ICD-10-CM

## 2019-08-10 MED ORDER — ATORVASTATIN CALCIUM 10 MG PO TABS: 10.0000 mg | ORAL_TABLET | Freq: Every day | ORAL | 6 refills | Status: DC

## 2019-08-10 MED ORDER — VITAMIN D (ERGOCALCIFEROL) 1.25 MG (50000 UNIT) PO CAPS: 50000.0000 [IU] | ORAL_CAPSULE | ORAL | 6 refills | Status: DC

## 2019-08-10 MED FILL — ?ATORVASTATIN 10 MG TABLET: 10 | 30 days supply | Qty: 30 | Fill #0

## 2019-08-10 MED FILL — VIT D2 1.25 MG (50,000 UNIT: 1.25 MG | 35 days supply | Qty: 5 | Fill #0

## 2019-08-30 ENCOUNTER — Ambulatory Visit (AMBULATORY_SURGERY_CENTER): Payer: Self-pay | Admitting: *Deleted

## 2019-08-30 ENCOUNTER — Other Ambulatory Visit: Payer: Self-pay

## 2019-08-30 VITALS — Temp 97.7°F | Ht 67.5 in | Wt 113.0 lb

## 2019-08-30 DIAGNOSIS — Z1211 Encounter for screening for malignant neoplasm of colon: Secondary | ICD-10-CM

## 2019-08-30 DIAGNOSIS — R7989 Other specified abnormal findings of blood chemistry: Secondary | ICD-10-CM

## 2019-08-30 DIAGNOSIS — Z1159 Encounter for screening for other viral diseases: Secondary | ICD-10-CM

## 2019-08-30 MED ORDER — SUPREP BOWEL PREP KIT 17.5-3.13-1.6 GM/177ML PO SOLN: 1.0000 | Freq: Once | ORAL | 0 refills | Status: AC

## 2019-08-30 NOTE — Progress Notes (Signed)
Patient denies any allergies to egg or soy products. Patient denies complications with anesthesia/sedation.  Patient denies oxygen use at home and denies diet medications. Emmi instructions for colonoscopy/endoscopy explained and given to patient.   

## 2019-08-31 LAB — COMPREHENSIVE METABOLIC PANEL
ALT: 30 IU/L (ref 0–44)
AST: 77 IU/L — ABNORMAL HIGH (ref 0–40)
Albumin/Globulin Ratio: 1.7 (ref 1.2–2.2)
Albumin: 4.5 g/dL (ref 3.8–4.8)
Alkaline Phosphatase: 118 IU/L — ABNORMAL HIGH (ref 39–117)
BUN/Creatinine Ratio: 7 — ABNORMAL LOW (ref 10–24)
BUN: 9 mg/dL (ref 8–27)
Bilirubin Total: 0.3 mg/dL (ref 0.0–1.2)
CO2: 24 mmol/L (ref 20–29)
Calcium: 9.5 mg/dL (ref 8.6–10.2)
Chloride: 99 mmol/L (ref 96–106)
Creatinine, Ser: 1.37 mg/dL — ABNORMAL HIGH (ref 0.76–1.27)
GFR calc Af Amer: 64 mL/min/{1.73_m2} (ref >59)
GFR calc non Af Amer: 55 mL/min/{1.73_m2} — ABNORMAL LOW (ref >59)
Globulin, Total: 2.6 g/dL (ref 1.5–4.5)
Glucose: 92 mg/dL (ref 65–99)
Potassium: 4.1 mmol/L (ref 3.5–5.2)
Sodium: 140 mmol/L (ref 134–144)
Total Protein: 7.1 g/dL (ref 6.0–8.5)

## 2019-09-08 ENCOUNTER — Other Ambulatory Visit: Payer: Self-pay | Admitting: Gastroenterology

## 2019-09-08 ENCOUNTER — Ambulatory Visit (INDEPENDENT_AMBULATORY_CARE_PROVIDER_SITE_OTHER): Payer: Self-pay

## 2019-09-08 DIAGNOSIS — Z1159 Encounter for screening for other viral diseases: Secondary | ICD-10-CM

## 2019-09-08 LAB — SARS CORONAVIRUS 2 (TAT 6-24 HRS): SARS Coronavirus 2: NEGATIVE

## 2019-09-13 ENCOUNTER — Ambulatory Visit (AMBULATORY_SURGERY_CENTER): Payer: Self-pay | Admitting: Gastroenterology

## 2019-09-13 ENCOUNTER — Encounter: Payer: Self-pay | Admitting: Gastroenterology

## 2019-09-13 ENCOUNTER — Other Ambulatory Visit: Payer: Self-pay

## 2019-09-13 VITALS — BP 114/69 | HR 81 | Temp 97.9°F | Resp 17 | Ht 67.5 in | Wt 113.0 lb

## 2019-09-13 DIAGNOSIS — Z1211 Encounter for screening for malignant neoplasm of colon: Secondary | ICD-10-CM

## 2019-09-13 DIAGNOSIS — Z800 Family history of malignant neoplasm of digestive organs: Secondary | ICD-10-CM

## 2019-09-13 MED ORDER — SODIUM CHLORIDE 0.9 % IV SOLN: 500.0000 mL | Freq: Once | INTRAVENOUS | Status: DC

## 2019-09-13 NOTE — Progress Notes (Signed)
Temp-LC VS-CW  Pt's states no medical or surgical changes since previsit or office visit.  

## 2019-09-13 NOTE — Op Note (Signed)
[facility] Patient Name: George Austin Procedure Date: 09/13/2019 8:53 AM MRN: [id number] Endoscopist: Milus Banister , MD Age: 64 Referring MD:  Date of Birth: 1956/02/22 Gender: Male Account #: [id number] Procedure:                Colonoscopy Indications:              Screening in patient at increased risk: Family                            history of 1st-degree relative with colorectal                            cancer; Mother had colon cancer in her 88s.                            Colonoscopy 2010 normal Baldo Ash, per patient) Medicines:                Monitored Anesthesia Care Procedure:                Pre-Anesthesia Assessment:                           - Prior to the procedure, a History and Physical                            was performed, and patient medications and                            allergies were reviewed. The patient's tolerance of                            previous anesthesia was also reviewed. The risks                            and benefits of the procedure and the sedation                            options and risks were discussed with the patient.                            All questions were answered, and informed consent                            was obtained. Prior Anticoagulants: The patient has                            taken no previous anticoagulant or antiplatelet                            agents. ASA Grade Assessment: II - A patient with                            mild systemic disease. After reviewing the risks  and benefits, the patient was deemed in                            satisfactory condition to undergo the procedure.                           After obtaining informed consent, the colonoscope                            was passed under direct vision. Throughout the                            procedure, the patient's blood pressure, pulse, and                            oxygen saturations were  monitored continuously. The                            Colonoscope was introduced through the anus and                            advanced to the the cecum, identified by                            appendiceal orifice and ileocecal valve. The                            colonoscopy was performed without difficulty. The                            patient tolerated the procedure well. The quality                            of the bowel preparation was good. The ileocecal                            valve, appendiceal orifice, and rectum were                            photographed. Scope In: 8:55:39 AM Scope Out: 9:06:32 AM Scope Withdrawal Time: 0 hours 6 minutes 56 seconds  Total Procedure Duration: 0 hours 10 minutes 53 seconds  Findings:                 The entire examined colon appeared normal on direct                            and retroflexion views. Complications:            No immediate complications. Estimated blood loss:                            None. Estimated Blood Loss:     Estimated blood loss: none. Impression:               - The entire examined colon is normal  on direct and                            retroflexion views.                           - No polyps or cancers. Recommendation:           - Patient has a contact number available for                            emergencies. The signs and symptoms of potential                            delayed complications were discussed with the                            patient. Return to normal activities tomorrow.                            Written discharge instructions were provided to the                            patient.                           - Resume previous diet.                           - Continue present medications.                           - Repeat colonoscopy in 5 years for screening. Rachael Fee, MD 09/13/2019 9:09:05 AM This report has been signed electronically.

## 2019-09-13 NOTE — Patient Instructions (Signed)
You may resume your previous diet and medication schedule.  Your next colonoscopy should occur in 5 years.  Thank you for allowing [place] to care for you today!!!  YOU HAD AN ENDOSCOPIC PROCEDURE TODAY AT THE [facility]:   Refer to the procedure report that was given to you for any specific questions about what was found during the examination.  If the procedure report does not answer your questions, please call your gastroenterologist to clarify.  If you requested that your care partner not be given the details of your procedure findings, then the procedure report has been included in a sealed envelope for you to review at your convenience later.  YOU SHOULD EXPECT: Some feelings of bloating in the abdomen. Passage of more gas than usual.  Walking can help get rid of the air that was put into your GI tract during the procedure and reduce the bloating. If you had a lower endoscopy (such as a colonoscopy or flexible sigmoidoscopy) you may notice spotting of blood in your stool or on the toilet paper. If you underwent a bowel prep for your procedure, you may not have a normal bowel movement for a few days.  Please Note:  You might notice some irritation and congestion in your nose or some drainage.  This is from the oxygen used during your procedure.  There is no need for concern and it should clear up in a day or so.  SYMPTOMS TO REPORT IMMEDIATELY:   Following lower endoscopy (colonoscopy or flexible sigmoidoscopy):  Excessive amounts of blood in the stool  Significant tenderness or worsening of abdominal pains  Swelling of the abdomen that is new, acute  Fever of 100F or higher  For urgent or emergent issues, a gastroenterologist can be reached at any hour by calling [phone].   DIET:  We do recommend a small meal at first, but then you may proceed to your regular diet.  Drink plenty of fluids but you should avoid alcoholic beverages for 24 hours.  ACTIVITY:  You should  plan to take it easy for the rest of today and you should NOT DRIVE or use heavy machinery until tomorrow (because of the sedation medicines used during the test).    FOLLOW UP: Our staff will call the number listed on your records 48-72 hours following your procedure to check on you and address any questions or concerns that you may have regarding the information given to you following your procedure. If we do not reach you, we will leave a message.  We will attempt to reach you two times.  During this call, we will ask if you have developed any symptoms of COVID 19. If you develop any symptoms (ie: fever, flu-like symptoms, shortness of breath, cough etc.) before then, please call [phone].  If you test positive for Covid 19 in the 2 weeks post procedure, please call and report this information to [place].    If any biopsies were taken you will be contacted by phone or by letter within the next 1-3 weeks.  Please call us at [phone] if you have not heard about the biopsies in 3 weeks.    SIGNATURES/CONFIDENTIALITY: You and/or your care partner have signed paperwork which will be entered into your electronic medical record.  These signatures attest to the fact that that the information above on your After Visit Summary has been reviewed and is understood.  Full responsibility of the confidentiality of this discharge information lies with you and/or  your care-partner.

## 2019-09-13 NOTE — Progress Notes (Signed)
To PACU, VSS. Report to [email or web address] 

## 2019-09-15 ENCOUNTER — Telehealth: Payer: Self-pay

## 2019-09-15 ENCOUNTER — Telehealth: Payer: Self-pay | Admitting: *Deleted

## 2019-09-15 NOTE — Telephone Encounter (Signed)
First follow up call attempt.  Message left on voicemail. 

## 2019-09-15 NOTE — Telephone Encounter (Signed)
  Follow up Call-  Call back number 09/13/2019  Post procedure Call Back phone  # [phone]  Permission to leave phone message Yes  Some recent data might be hidden     Patient questions:  Do you have a fever, pain , or abdominal swelling? No. Pain Score  0 *  Have you tolerated food without any problems? Yes.    Have you been able to return to your normal activities? Yes.    Do you have any questions about your discharge instructions: Diet   No. Medications  No. Follow up visit  No.  Do you have questions or concerns about your Care? No.  Actions: * If pain score is 4 or above: No action needed, pain <4.  1. Have you developed a fever since your procedure? no  2.   Have you had an respiratory symptoms (SOB or cough) since your procedure? no  3.   Have you tested positive for COVID 19 since your procedure no  4.   Have you had any family members/close contacts diagnosed with the COVID 19 since your procedure?  no   If yes to any of these questions please route to Laverna Peace, RN and Jennye Boroughs, Charity fundraiser.

## 2019-09-29 MED FILL — ?ERGOCALCIFEROL 50000 UNITC: 1.25 MG | 35 days supply | Qty: 5 | Fill #1

## 2019-11-17 MED FILL — ?CARVEDILOL 12.5 MG TABLET: 12.5 | 30 days supply | Qty: 30 | Fill #3

## 2019-11-17 MED FILL — ?ATORVASTATIN CALCIUM 10MG: 10 | 30 days supply | Qty: 30 | Fill #1

## 2020-01-26 ENCOUNTER — Ambulatory Visit (INDEPENDENT_AMBULATORY_CARE_PROVIDER_SITE_OTHER): Payer: Self-pay | Admitting: Family Medicine

## 2020-01-26 ENCOUNTER — Encounter: Payer: Self-pay | Admitting: Family Medicine

## 2020-01-26 ENCOUNTER — Ambulatory Visit: Payer: Self-pay | Admitting: Family Medicine

## 2020-01-26 ENCOUNTER — Other Ambulatory Visit: Payer: Self-pay

## 2020-01-26 VITALS — BP 119/70 | HR 87 | Temp 98.0°F | Ht 67.5 in | Wt 104.6 lb

## 2020-01-26 DIAGNOSIS — R634 Abnormal weight loss: Secondary | ICD-10-CM | POA: Insufficient documentation

## 2020-01-26 DIAGNOSIS — R7989 Other specified abnormal findings of blood chemistry: Secondary | ICD-10-CM

## 2020-01-26 DIAGNOSIS — Z09 Encounter for follow-up examination after completed treatment for conditions other than malignant neoplasm: Secondary | ICD-10-CM

## 2020-01-26 DIAGNOSIS — J302 Other seasonal allergic rhinitis: Secondary | ICD-10-CM

## 2020-01-26 DIAGNOSIS — I10 Essential (primary) hypertension: Secondary | ICD-10-CM

## 2020-01-26 LAB — POCT URINALYSIS DIPSTICK
Blood, UA: NEGATIVE
Glucose, UA: NEGATIVE
Nitrite, UA: NEGATIVE
Protein, UA: POSITIVE — AB
Spec Grav, UA: 1.025 (ref 1.010–1.025)
Urobilinogen, UA: 0.2 E.U./dL
pH, UA: 5.5 (ref 5.0–8.0)

## 2020-01-26 NOTE — Progress Notes (Signed)
Patient [facility]   Established Patient Office Visit  Subjective:  Patient ID: George Austin, male    DOB: June 13, 1956  Age: 64 y.o. MRN: [id number]  CC:  Chief Complaint  Patient presents with  . Follow-up    HTN    HPI George Austin is a 64 year old male who presents for Follow Up today.   Past Medical History:  Diagnosis Date  . Allergy   . Elevated serum creatinine 07/2019  . Hyperlipidemia 07/2019  . Hypertension   . Vitamin D deficiency 07/2019   Patient Active Problem List   Diagnosis Date Noted  . Hyperlipidemia 07/28/2019  . Seasonal allergies 07/28/2019  . Vitamin D deficiency 01/21/2019  . HTN (hypertension) 05/03/2013  . Acute kidney injury ([place]) 05/03/2013  . Right shoulder pain 05/03/2013  . Tobacco use disorder 05/03/2013    Current Status: Since his last office visit, he is doing well with no complaints. He states that his appetite is decreased and he states that he doesn't have time to eat prior to getting to work at time. He has also had a history of elevated creatinine levels. She denies urinary frequency, urgency, dribbling, discharge, dysuria, urinary itching, burning, odor, hematuria, and suprapubic pain/discomfort. He denies visual changes, chest pain, cough, shortness of breath, heart palpitations, and falls. He has occasional headaches and dizziness with position changes. Denies severe headaches, confusion, seizures, double vision, and blurred vision, nausea and vomiting. He denies fevers, chills, fatigue, recent infections, weight loss, and night sweats. Denies GI problems such as diarrhea, and constipation. He has no reports of blood in stools. No depression or anxiety reported today.He is taking all medications as prescribed. He denies pain today.    Past Surgical History:  Procedure Laterality Date  . COLONOSCOPY     over 10 yrs ago in [place], normal per patient 08/30/19  . CYST EXCISION Left    thigh  . MULTIPLE TOOTH EXTRACTIONS    . WISDOM TOOTH EXTRACTION      Family History  Problem Relation Age of Onset  . Diabetes Mother   . Stomach cancer Mother   . Hypertension Father   . Diabetes Brother   . Colon cancer Neg Hx   . Rectal cancer Neg Hx   . Esophageal cancer Neg Hx     Social History   Socioeconomic History  . Marital status: Single    Spouse name: Not on file  . Number of children: Not on file  . Years of education: Not on file  . Highest education level: Not on file  Occupational History  . Not on file  Tobacco Use  . Smoking status: Current Every Day Smoker    Packs/day: 0.50    Years: 35.00    Pack years: 17.50    Types: Cigarettes  . Smokeless tobacco: Never Used  . Tobacco comment: smoking .25 - 0.33 ppd  Substance and Sexual Activity  . Alcohol use: Yes    Alcohol/week: 10.0 standard drinks    Types: 10 Shots of liquor per week    Comment: liquor  . Drug use: No  . Sexual activity: Yes  Other Topics Concern  . Not on file  Social History Narrative  . Not on file   Social Determinants of Health   Financial Resource Strain:   . Difficulty of Paying Living Expenses:   Food Insecurity:   . Worried About Charity fundraiser in the Last Year:   .  Ran Out of Food in the Last Year:   Transportation Needs:   . Freight forwarder (Medical):   Marland Kitchen Lack of Transportation (Non-Medical):   Physical Activity:   . Days of Exercise per Week:   . Minutes of Exercise per Session:   Stress:   . Feeling of Stress :   Social Connections:   . Frequency of Communication with Friends and Family:   . Frequency of Social Gatherings with Friends and Family:   . Attends Religious Services:   . Active Member of Clubs or Organizations:   . Attends Banker Meetings:   Marland Kitchen Marital Status:   Intimate Partner Violence:   . Fear of Current or Ex-Partner:   . Emotionally Abused:   Marland Kitchen Physically Abused:   . Sexually Abused:     Outpatient  Medications Prior to Visit  Medication Sig Dispense Refill  . amLODipine (NORVASC) 5 MG tablet Take 1 tablet (5 mg total) by mouth daily. 30 tablet 6  . atorvastatin (LIPITOR) 10 MG tablet Take 1 tablet (10 mg total) by mouth daily. 30 tablet 6  . azelastine (ASTELIN) 0.1 % nasal spray Place 2 sprays into both nostrils daily. Use in each nostril as directed 30 mL 3  . carvedilol (COREG) 12.5 MG tablet Take 1 tablet (12.5 mg total) by mouth daily. 30 tablet 6  . sildenafil (VIAGRA) 25 MG tablet Take 1 tablet (25 mg total) by mouth daily as needed for erectile dysfunction. 10 tablet 6  . Vitamin D, Ergocalciferol, (DRISDOL) 1.25 MG (50000 UT) CAPS capsule Take 1 capsule (50,000 Units total) by mouth every 7 (seven) days. 5 capsule 6  . fluticasone (FLONASE) 50 MCG/ACT nasal spray Place 2 sprays into both nostrils daily. (Patient not taking: Reported on 01/26/2020) 16 g 6   No facility-administered medications prior to visit.    No Known Allergies  ROS Review of Systems  Constitutional: Positive for appetite change (decreased).  HENT: Negative.   Eyes: Negative.   Respiratory: Positive for cough (occasional r/t seasonal allergies).   Cardiovascular: Negative.   Gastrointestinal: Negative.   Endocrine: Negative.   Genitourinary: Negative.   Musculoskeletal: Negative.   Skin: Negative.   Allergic/Immunologic:       Seasonal allergies  Neurological: Positive for dizziness (occasional ) and headaches (occasional ).  Hematological: Negative.   Psychiatric/Behavioral: Negative.       Objective:    Physical Exam  Constitutional: He is oriented to person, place, and time.  Possibly malnourished.   HENT:  Head: Normocephalic and atraumatic.  Eyes: Conjunctivae are normal.  Cardiovascular: Normal rate, regular rhythm, normal heart sounds and intact distal pulses.  Pulmonary/Chest: Effort normal and breath sounds normal.  Abdominal: Soft. Bowel sounds are normal.  Musculoskeletal:         General: Normal range of motion.     Cervical back: Normal range of motion and neck supple.  Neurological: He is alert and oriented to person, place, and time. He has normal reflexes.  Skin: Skin is warm and dry.  Psychiatric: He has a normal mood and affect. His behavior is normal. Judgment and thought content normal.  Nursing note and vitals reviewed.   BP 119/70   Pulse 87   Temp 98 F (36.7 C)   Ht 5' 7.5" (1.715 m)   Wt 104 lb 9.6 oz (47.4 kg)   SpO2 99%   BMI 16.14 kg/m  Wt Readings from Last 3 Encounters:  01/26/20 104 lb 9.6 oz (47.4 kg)  09/13/19 113 lb (51.3 kg)  08/30/19 113 lb (51.3 kg)     Health Maintenance Due  Topic Date Due  . Hepatitis C Screening  Never done  . COVID-19 Vaccine (1) Never done    There are no preventive care reminders to display for this patient.  Lab Results  Component Value Date   TSH 0.480 07/28/2019   Lab Results  Component Value Date   WBC 6.4 07/28/2019   HGB 13.1 07/28/2019   HCT 40.3 07/28/2019   MCV 99 (H) 07/28/2019   PLT 248 07/28/2019   Lab Results  Component Value Date   NA 140 08/30/2019   K 4.1 08/30/2019   CO2 24 08/30/2019   GLUCOSE 92 08/30/2019   BUN 9 08/30/2019   CREATININE 1.37 (H) 08/30/2019   BILITOT 0.3 08/30/2019   ALKPHOS 118 (H) 08/30/2019   AST 77 (H) 08/30/2019   ALT 30 08/30/2019   PROT 7.1 08/30/2019   ALBUMIN 4.5 08/30/2019   CALCIUM 9.5 08/30/2019   Lab Results  Component Value Date   CHOL 222 (H) 07/28/2019   Lab Results  Component Value Date   HDL 77 07/28/2019   Lab Results  Component Value Date   LDLCALC 111 (H) 07/28/2019   Lab Results  Component Value Date   TRIG 202 (H) 07/28/2019   Lab Results  Component Value Date   CHOLHDL 2.9 07/28/2019   Lab Results  Component Value Date   HGBA1C 5.0 07/28/2019    Assessment & Plan:   1. Essential hypertension The current medical regimen is effective; blood pressure is stable at 119/70 today; continue present plan  and medications as prescribed. He will continue to take medications as prescribed, to decrease high sodium intake, excessive alcohol intake, increase potassium intake, smoking cessation, and increase physical activity of at least 30 minutes of cardio activity daily. He will continue to follow Heart Healthy or DASH diet. - POCT urinalysis dipstick  2. Weight loss Recent 10 lb weight loss. Nutritional supplements given to patient today. Encouraged him to eat 3 small meals a day, with healthy snacks in between.   3. Seasonal allergies He will continue Flonase as needed.   4. Elevated serum creatinine We will re-assess status today. Denies urinary issues. Monitor.  - Comprehensive metabolic panel; Future - Microalbumin/Creatinine Ratio, Urine - Comprehensive metabolic panel  5. Follow up He will follow up in 6 months.  No orders of the defined types were placed in this encounter.   Orders Placed This Encounter  Procedures  . Comprehensive metabolic panel  . Microalbumin/Creatinine Ratio, Urine  . POCT urinalysis dipstick    Referral Orders  No referral(s) requested today    Raliegh Ip,  MSN, FNP-BC [facility] [address] [place], [place] [address] [phone] fax  Problem List Items Addressed This Visit      Cardiovascular and Mediastinum   HTN (hypertension) - Primary   Relevant Orders   POCT urinalysis dipstick (Completed)     Other   Seasonal allergies    Other Visit Diagnoses    Weight loss       Elevated serum creatinine       Relevant Orders   Comprehensive metabolic panel   Microalbumin/Creatinine Ratio, Urine   Follow up          No orders of the defined types were placed in this encounter.   Follow-up: Return in about 6 months (around  07/27/2020).    Kallie Locks, FNP

## 2020-01-27 LAB — MICROALBUMIN / CREATININE URINE RATIO
Creatinine, Urine: 223.4 mg/dL
Microalb/Creat Ratio: 93 mg/g creat — ABNORMAL HIGH (ref 0–29)
Microalbumin, Urine: 208.4 ug/mL

## 2020-01-27 LAB — COMPREHENSIVE METABOLIC PANEL
ALT: 36 IU/L (ref 0–44)
AST: 81 IU/L — ABNORMAL HIGH (ref 0–40)
Albumin/Globulin Ratio: 1.8 (ref 1.2–2.2)
Albumin: 4.6 g/dL (ref 3.8–4.8)
Alkaline Phosphatase: 73 IU/L (ref 48–121)
BUN/Creatinine Ratio: 9 — ABNORMAL LOW (ref 10–24)
BUN: 14 mg/dL (ref 8–27)
Bilirubin Total: 0.5 mg/dL (ref 0.0–1.2)
CO2: 25 mmol/L (ref 20–29)
Calcium: 9.9 mg/dL (ref 8.6–10.2)
Chloride: 95 mmol/L — ABNORMAL LOW (ref 96–106)
Creatinine, Ser: 1.53 mg/dL — ABNORMAL HIGH (ref 0.76–1.27)
GFR calc Af Amer: 56 mL/min/{1.73_m2} — ABNORMAL LOW (ref >59)
GFR calc non Af Amer: 48 mL/min/{1.73_m2} — ABNORMAL LOW (ref >59)
Globulin, Total: 2.5 g/dL (ref 1.5–4.5)
Glucose: 100 mg/dL — ABNORMAL HIGH (ref 65–99)
Potassium: 4.1 mmol/L (ref 3.5–5.2)
Sodium: 138 mmol/L (ref 134–144)
Total Protein: 7.1 g/dL (ref 6.0–8.5)

## 2020-01-31 ENCOUNTER — Telehealth: Payer: Self-pay | Admitting: Family Medicine

## 2020-01-31 NOTE — Telephone Encounter (Signed)
Message left for patient to return call to office to review labs.

## 2020-02-25 ENCOUNTER — Other Ambulatory Visit: Payer: Self-pay | Admitting: Family Medicine

## 2020-02-25 DIAGNOSIS — I10 Essential (primary) hypertension: Secondary | ICD-10-CM

## 2020-02-25 MED FILL — ATORVASTATIN 10 MG TABLET: 10 | 30 days supply | Qty: 30 | Fill #2

## 2020-02-25 MED FILL — CARVEDILOL 12.5 MG TABLET: 12.5 | 30 days supply | Qty: 30 | Fill #0

## 2020-05-26 MED FILL — ATORVASTATIN 10 MG TABLET: 10 | 30 days supply | Qty: 30 | Fill #3

## 2020-05-26 MED FILL — CARVEDILOL 12.5 MG TABLET: 12.5 | 30 days supply | Qty: 30 | Fill #1

## 2020-06-09 ENCOUNTER — Ambulatory Visit: Payer: Self-pay | Admitting: Family Medicine

## 2020-06-09 ENCOUNTER — Other Ambulatory Visit: Payer: Self-pay

## 2020-06-15 ENCOUNTER — Ambulatory Visit (INDEPENDENT_AMBULATORY_CARE_PROVIDER_SITE_OTHER): Payer: Self-pay | Admitting: Nurse Practitioner

## 2020-06-15 ENCOUNTER — Other Ambulatory Visit: Payer: Self-pay

## 2020-06-15 ENCOUNTER — Encounter: Payer: Self-pay | Admitting: Nurse Practitioner

## 2020-06-15 ENCOUNTER — Other Ambulatory Visit: Payer: Self-pay | Admitting: Nurse Practitioner

## 2020-06-15 VITALS — BP 174/95 | HR 80 | Temp 98.7°F | Resp 17 | Ht 67.0 in | Wt 103.8 lb

## 2020-06-15 DIAGNOSIS — G44019 Episodic cluster headache, not intractable: Secondary | ICD-10-CM

## 2020-06-15 DIAGNOSIS — I10 Essential (primary) hypertension: Secondary | ICD-10-CM

## 2020-06-15 DIAGNOSIS — R634 Abnormal weight loss: Secondary | ICD-10-CM

## 2020-06-15 MED ORDER — CLONIDINE HCL 0.1 MG PO TABS
0.1000 mg | ORAL_TABLET | Freq: Once | ORAL | Status: AC
  Administered 2020-06-15: 0.1 mg via ORAL

## 2020-06-15 NOTE — Progress Notes (Signed)
[facility] Patient [facility] [address] [place], [place]  [address] Phone:  [phone]   Fax:  [phone]   Established Patient Office Visit  Subjective:  Patient ID: George Austin, male    DOB: 05/07/1956  Age: 64 y.o. MRN: [id number]  CC:  Chief Complaint  Patient presents with  . Follow-up    Pt states he is still headache. X1wk. Pt states the headache starts in the evening. Pt states the headache is on the left side of his head.    HPI LIRON EISSLER presents for headache. He  has a past medical history of Allergy, Elevated serum creatinine (07/2019), Hyperlipidemia (07/2019), Hypertension, and Vitamin D deficiency (07/2019).   Headache Patient presents for evaluation of headache. Symptoms began about 2 weeks ago. Generally, the headaches last about 5 hours and occur every evening. The headaches do not seem to be related to any time of the day. The headaches are usually throbbing and are located in left side.  The patient rates his most severe headaches a 5 on a scale from 1 to 10. Recently, the headaches have been stable. Work attendance or other daily activities are not affected by the headaches. Precipitating factors include: none. The headaches are usually not preceded by an aura. Associated neurologic symptoms: vision problems. His last eye exam was 2 yrs ago.The patient denies dizziness, loss of balance, muscle weakness, numbness of extremities, speech difficulties, vomiting in the early morning and worsening work performance. Home treatment has included acetaminophen with marked improvement. Other history includes: nothing pertinent. Family history includes sister with headaches. He works long hours 8-10. He was taking his BP when the headache was at its worse. This was taken at [facility] [address]. He is currently on Carvedilol 12.5mg  qd. He has not taken the Amlodipine 5 mg since june   Past Medical History:  Diagnosis Date  . Allergy   . Elevated serum creatinine  07/2019  . Hyperlipidemia 07/2019  . Hypertension   . Vitamin D deficiency 07/2019    Past Surgical History:  Procedure Laterality Date  . COLONOSCOPY     over 10 yrs ago in [place], normal per patient 08/30/19  . CYST EXCISION Left    thigh  . MULTIPLE TOOTH EXTRACTIONS    . WISDOM TOOTH EXTRACTION      Family History  Problem Relation Age of Onset  . Diabetes Mother   . Stomach cancer Mother   . Hypertension Father   . Diabetes Brother   . Colon cancer Neg Hx   . Rectal cancer Neg Hx   . Esophageal cancer Neg Hx     Social History   Socioeconomic History  . Marital status: Single    Spouse name: Not on file  . Number of children: Not on file  . Years of education: Not on file  . Highest education level: Not on file  Occupational History  . Not on file  Tobacco Use  . Smoking status: Current Every Day Smoker    Packs/day: 0.50    Years: 35.00    Pack years: 17.50    Types: Cigarettes  . Smokeless tobacco: Never Used  . Tobacco comment: smoking .25 - 0.33 ppd  Vaping Use  . Vaping Use: Never used  Substance and Sexual Activity  . Alcohol use: Yes    Alcohol/week: 10.0 standard drinks    Types: 10 Shots of liquor per week    Comment: liquor  . Drug use: No  .  Sexual activity: Yes  Other Topics Concern  . Not on file  Social History Narrative  . Not on file   Social Determinants of Health   Financial Resource Strain:   . Difficulty of Paying Living Expenses: Not on file  Food Insecurity:   . Worried About Programme researcher, broadcasting/film/video in the Last Year: Not on file  . Ran Out of Food in the Last Year: Not on file  Transportation Needs:   . Lack of Transportation (Medical): Not on file  . Lack of Transportation (Non-Medical): Not on file  Physical Activity:   . Days of Exercise per Week: Not on file  . Minutes of Exercise per Session: Not on file  Stress:   . Feeling of Stress : Not on file  Social Connections:   . Frequency of Communication with Friends  and Family: Not on file  . Frequency of Social Gatherings with Friends and Family: Not on file  . Attends Religious Services: Not on file  . Active Member of Clubs or Organizations: Not on file  . Attends Banker Meetings: Not on file  . Marital Status: Not on file  Intimate Partner Violence:   . Fear of Current or Ex-Partner: Not on file  . Emotionally Abused: Not on file  . Physically Abused: Not on file  . Sexually Abused: Not on file    Outpatient Medications Prior to Visit  Medication Sig Dispense Refill  . atorvastatin (LIPITOR) 10 MG tablet Take 1 tablet (10 mg total) by mouth daily. 30 tablet 6  . carvedilol (COREG) 12.5 MG tablet TAKE 1 TABLET (12.5 MG TOTAL) BY MOUTH DAILY. 30 tablet 6  . sildenafil (VIAGRA) 25 MG tablet Take 1 tablet (25 mg total) by mouth daily as needed for erectile dysfunction. 10 tablet 6  . Vitamin D, Ergocalciferol, (DRISDOL) 1.25 MG (50000 UT) CAPS capsule Take 1 capsule (50,000 Units total) by mouth every 7 (seven) days. 5 capsule 6  . azelastine (ASTELIN) 0.1 % nasal spray Place 2 sprays into both nostrils daily. Use in each nostril as directed (Patient not taking: Reported on 06/15/2020) 30 mL 3  . fluticasone (FLONASE) 50 MCG/ACT nasal spray Place 2 sprays into both nostrils daily. (Patient not taking: Reported on 01/26/2020) 16 g 6  . amLODipine (NORVASC) 5 MG tablet Take 1 tablet (5 mg total) by mouth daily. (Patient not taking: Reported on 06/15/2020) 30 tablet 6   No facility-administered medications prior to visit.    No Known Allergies  ROS Review of Systems    Objective:    Physical Exam Constitutional:      Comments: underwight  HENT:     Head: Normocephalic and atraumatic.     Nose: Nose normal.     Mouth/Throat:     Mouth: Mucous membranes are moist.  Cardiovascular:     Rate and Rhythm: Normal rate and regular rhythm.     Pulses: Normal pulses.     Heart sounds: Normal heart sounds.  Pulmonary:     Effort:  Pulmonary effort is normal.     Breath sounds: Normal breath sounds.  Musculoskeletal:        General: Normal range of motion.     Cervical back: Normal range of motion.  Skin:    General: Skin is warm and dry.     Capillary Refill: Capillary refill takes less than 2 seconds.  Neurological:     General: No focal deficit present.     Mental Status: He  is alert and oriented to person, place, and time.  Psychiatric:        Mood and Affect: Mood normal.        Behavior: Behavior normal.        Thought Content: Thought content normal.        Judgment: Judgment normal.     BP (!) 174/95 (BP Location: Right Arm, Patient Position: Sitting, Cuff Size: Small)   Pulse 80   Temp 98.7 F (37.1 C)   Resp 17   Ht 5\' 7"  (1.702 m)   Wt 103 lb 12.8 oz (47.1 kg)   SpO2 100%   BMI 16.26 kg/m  Wt Readings from Last 3 Encounters:  06/15/20 103 lb 12.8 oz (47.1 kg)  01/26/20 104 lb 9.6 oz (47.4 kg)  09/13/19 113 lb (51.3 kg)     Health Maintenance Due  Topic Date Due  . Hepatitis C Screening  Never done  . COVID-19 Vaccine (1) Never done    There are no preventive care reminders to display for this patient.  Lab Results  Component Value Date   TSH 0.459 06/15/2020   Lab Results  Component Value Date   WBC 6.4 07/28/2019   HGB 13.1 07/28/2019   HCT 40.3 07/28/2019   MCV 99 (H) 07/28/2019   PLT 248 07/28/2019   Lab Results  Component Value Date   NA 140 06/15/2020   K 4.3 06/15/2020   CO2 25 01/26/2020   GLUCOSE 90 06/15/2020   BUN 10 06/15/2020   CREATININE 1.20 06/15/2020   BILITOT 0.2 06/15/2020   ALKPHOS 82 06/15/2020   AST 33 06/15/2020   ALT 36 01/26/2020   PROT 6.3 06/15/2020   ALBUMIN 3.8 06/15/2020   CALCIUM 9.1 06/15/2020   Lab Results  Component Value Date   CHOL 222 (H) 07/28/2019   Lab Results  Component Value Date   HDL 77 07/28/2019   Lab Results  Component Value Date   LDLCALC 111 (H) 07/28/2019   Lab Results  Component Value Date   TRIG  202 (H) 07/28/2019   Lab Results  Component Value Date   CHOLHDL 2.9 07/28/2019   Lab Results  Component Value Date   HGBA1C 5.0 07/28/2019      Assessment & Plan:   Problem List Items Addressed This Visit      Other   Weight loss   Relevant Orders   TSH    Other Visit Diagnoses    Essential hypertension    -  Primary Restart the Amlodipine pt has medication at home. Will discuss further with PCP if not indicated. FU with PCP as scheduled Encouraged on going compliance with current medication regimen Encouraged home monitoring and recording BP <130/80 Eating a heart-healthy diet with less salt Encouraged regular physical activity    Relevant Medications   cloNIDine (CATAPRES) tablet 0.1 mg (Completed)   Other Relevant Orders   Urinalysis Dipstick   Comp. Metabolic Panel (12)   Magnesium   Episodic cluster headache, not intractable       Relevant Orders   Magnesium      Meds ordered this encounter  Medications  . cloNIDine (CATAPRES) tablet 0.1 mg    Follow-up: Return for Appointment As Scheduled.    14/09/2018, NP

## 2020-06-15 NOTE — Patient Instructions (Signed)
Clonidine/Catapres was given in the office for BP.

## 2020-06-16 ENCOUNTER — Telehealth: Payer: Self-pay | Admitting: Family Medicine

## 2020-06-16 ENCOUNTER — Other Ambulatory Visit: Payer: Self-pay | Admitting: Family Medicine

## 2020-06-16 ENCOUNTER — Other Ambulatory Visit: Payer: Self-pay

## 2020-06-16 DIAGNOSIS — I10 Essential (primary) hypertension: Secondary | ICD-10-CM

## 2020-06-16 LAB — COMP. METABOLIC PANEL (12)
AST: 33 IU/L (ref 0–40)
Albumin/Globulin Ratio: 1.5 (ref 1.2–2.2)
Albumin: 3.8 g/dL (ref 3.8–4.8)
Alkaline Phosphatase: 82 IU/L (ref 44–121)
BUN/Creatinine Ratio: 8 — ABNORMAL LOW (ref 10–24)
BUN: 10 mg/dL (ref 8–27)
Bilirubin Total: 0.2 mg/dL (ref 0.0–1.2)
Calcium: 9.1 mg/dL (ref 8.6–10.2)
Chloride: 101 mmol/L (ref 96–106)
Creatinine, Ser: 1.2 mg/dL (ref 0.76–1.27)
GFR calc Af Amer: 74 mL/min/{1.73_m2} (ref >59)
GFR calc non Af Amer: 64 mL/min/{1.73_m2} (ref >59)
Globulin, Total: 2.5 g/dL (ref 1.5–4.5)
Glucose: 90 mg/dL (ref 65–99)
Potassium: 4.3 mmol/L (ref 3.5–5.2)
Sodium: 140 mmol/L (ref 134–144)
Total Protein: 6.3 g/dL (ref 6.0–8.5)

## 2020-06-16 LAB — TSH: TSH: 0.459 u[IU]/mL (ref 0.450–4.500)

## 2020-06-16 LAB — MAGNESIUM: Magnesium: 1.8 mg/dL (ref 1.6–2.3)

## 2020-06-16 MED ORDER — AMLODIPINE BESYLATE 5 MG PO TABS: 5.0000 mg | ORAL_TABLET | Freq: Every day | ORAL | 6 refills | Status: DC

## 2020-06-16 MED FILL — AMLODIPINE BESYLATE 5 MG TA: 5 | 30 days supply | Qty: 30 | Fill #0

## 2020-06-19 NOTE — Telephone Encounter (Signed)
Sent to Provider 

## 2020-06-22 ENCOUNTER — Encounter: Payer: Self-pay | Admitting: Nurse Practitioner

## 2020-06-23 NOTE — Progress Notes (Signed)
Pt was called to discuss his lab results. Pt did not answer so a message was left for him to call us back.

## 2020-06-27 ENCOUNTER — Telehealth: Payer: Self-pay | Admitting: Family Medicine

## 2020-06-29 NOTE — Telephone Encounter (Signed)
Sent to CMA 

## 2020-07-27 MED FILL — CARVEDILOL 12.5 MG TABLET: 12.5 | 30 days supply | Qty: 30 | Fill #2

## 2020-07-28 ENCOUNTER — Ambulatory Visit: Payer: Self-pay | Admitting: Family Medicine

## 2020-09-12 MED FILL — CARVEDILOL 12.5 MG TABLET: 12.5 | 30 days supply | Qty: 30 | Fill #3

## 2020-12-12 ENCOUNTER — Other Ambulatory Visit: Payer: Self-pay

## 2020-12-12 MED FILL — Carvedilol Tab 12.5 MG: ORAL | 30 days supply | Qty: 30 | Fill #0 | Status: AC

## 2020-12-13 ENCOUNTER — Other Ambulatory Visit: Payer: Self-pay

## 2020-12-14 ENCOUNTER — Other Ambulatory Visit: Payer: Self-pay

## 2020-12-14 ENCOUNTER — Other Ambulatory Visit: Payer: Self-pay | Admitting: Family Medicine

## 2020-12-14 DIAGNOSIS — N529 Male erectile dysfunction, unspecified: Secondary | ICD-10-CM

## 2020-12-20 ENCOUNTER — Other Ambulatory Visit: Payer: Self-pay

## 2021-02-14 ENCOUNTER — Other Ambulatory Visit: Payer: Self-pay

## 2021-02-14 MED FILL — Carvedilol Tab 12.5 MG: ORAL | 30 days supply | Qty: 30 | Fill #1 | Status: AC

## 2021-02-15 ENCOUNTER — Other Ambulatory Visit: Payer: Self-pay | Admitting: Nurse Practitioner

## 2021-02-15 ENCOUNTER — Other Ambulatory Visit: Payer: Self-pay

## 2021-02-15 DIAGNOSIS — J302 Other seasonal allergic rhinitis: Secondary | ICD-10-CM

## 2021-02-15 MED ORDER — FLUTICASONE PROPIONATE 50 MCG/ACT NA SUSP
2.0000 | Freq: Every day | NASAL | 6 refills | Status: DC
  Filled 2021-02-15: qty 16, 30d supply, fill #0

## 2021-02-16 ENCOUNTER — Other Ambulatory Visit: Payer: Self-pay

## 2021-02-16 ENCOUNTER — Telehealth: Payer: Self-pay

## 2021-02-16 NOTE — Telephone Encounter (Signed)
Patient stated he picked up rx.

## 2021-02-16 NOTE — Telephone Encounter (Signed)
[facility] (Fluticasone)

## 2021-04-25 ENCOUNTER — Ambulatory Visit: Payer: Self-pay | Admitting: Nurse Practitioner

## 2021-05-03 ENCOUNTER — Ambulatory Visit (INDEPENDENT_AMBULATORY_CARE_PROVIDER_SITE_OTHER): Payer: Self-pay | Admitting: Nurse Practitioner

## 2021-05-03 ENCOUNTER — Other Ambulatory Visit: Payer: Self-pay

## 2021-05-03 ENCOUNTER — Encounter: Payer: Self-pay | Admitting: Nurse Practitioner

## 2021-05-03 VITALS — BP 148/84 | HR 92 | Temp 98.1°F | Wt 106.2 lb

## 2021-05-03 DIAGNOSIS — R5383 Other fatigue: Secondary | ICD-10-CM

## 2021-05-03 DIAGNOSIS — E785 Hyperlipidemia, unspecified: Secondary | ICD-10-CM

## 2021-05-03 DIAGNOSIS — M24541 Contracture, right hand: Secondary | ICD-10-CM

## 2021-05-03 DIAGNOSIS — E782 Mixed hyperlipidemia: Secondary | ICD-10-CM

## 2021-05-03 DIAGNOSIS — I10 Essential (primary) hypertension: Secondary | ICD-10-CM

## 2021-05-03 DIAGNOSIS — M65351 Trigger finger, right little finger: Secondary | ICD-10-CM

## 2021-05-03 DIAGNOSIS — M65341 Trigger finger, right ring finger: Secondary | ICD-10-CM

## 2021-05-03 MED ORDER — AMLODIPINE BESYLATE 5 MG PO TABS
ORAL_TABLET | Freq: Every day | ORAL | 6 refills | Status: DC
  Filled 2021-05-03: qty 30, 30d supply, fill #0
  Filled 2021-07-22: qty 30, 30d supply, fill #1
  Filled 2021-10-15: qty 30, 30d supply, fill #2
  Filled 2021-10-15: qty 30, 30d supply, fill #0
  Filled 2021-11-20: qty 30, 30d supply, fill #1
  Filled 2021-12-26: qty 30, 30d supply, fill #2
  Filled 2022-02-01: qty 30, 30d supply, fill #3
  Filled 2022-03-07: qty 30, 30d supply, fill #4

## 2021-05-03 MED ORDER — CARVEDILOL 12.5 MG PO TABS
ORAL_TABLET | Freq: Every day | ORAL | 6 refills | Status: DC
  Filled 2021-05-03: qty 30, 30d supply, fill #0
  Filled 2021-06-21: qty 30, 30d supply, fill #1
  Filled 2021-07-22: qty 30, 30d supply, fill #2
  Filled 2021-08-31: qty 30, 30d supply, fill #0
  Filled 2021-08-31: qty 30, 30d supply, fill #3

## 2021-05-03 NOTE — Progress Notes (Signed)
[facility] Patient [facility] [address] [place], [place]  [address] Phone:  [phone]   Fax:  [phone]   Established Patient Office Visit  Subjective:  Patient ID: George Austin, male    DOB: 07-06-1956  Age: 65 y.o. MRN: [id number]  CC:  Chief Complaint  Patient presents with   Follow-up    Pt is here today for a follow up visit. Pt states that his right pink finger is bent downward is locked in place.    HPI George Austin presents for follow up. .A former patient of NP Stroud.  He  has a past medical history of Allergy, Elevated serum creatinine (07/2019), Hyperlipidemia (07/2019), Hypertension, and Vitamin D deficiency (07/2019).   Hypertension Patient is here for follow-up of elevated blood pressure. He is not exercising and is adherent to a low-salt diet. Blood pressure is not monitored at home. Cardiac symptoms: fatigue. Patient denies chest pain, chest pressure/discomfort, claudication, dyspnea, exertional chest pressure/discomfort, irregular heart beat, lower extremity edema, near-syncope, orthopnea, palpitations, paroxysmal nocturnal dyspnea, syncope, and tachypnea. Cardiovascular risk factors: hypertension, male gender, sedentary lifestyle, and smoking/ tobacco exposure. Use of agents associated with hypertension: none. History of target organ damage: none. He reports that he does not take the amlodipine as prescribed.  He has never started this.   He reports the inability to straighten out his pinky finger on his right hand. He reports that this has been going on for some time. However it is getting tighter and he is no longer able to straighten.   Past Medical History:  Diagnosis Date   Allergy    Elevated serum creatinine 07/2019   Hyperlipidemia 07/2019   Hypertension    Vitamin D deficiency 07/2019    Past Surgical History:  Procedure Laterality Date   COLONOSCOPY     over 10 yrs ago in [place], normal per patient 08/30/19   CYST EXCISION Left     thigh   MULTIPLE TOOTH EXTRACTIONS     WISDOM TOOTH EXTRACTION      Family History  Problem Relation Age of Onset   Diabetes Mother    Stomach cancer Mother    Hypertension Father    Diabetes Brother    Colon cancer Neg Hx    Rectal cancer Neg Hx    Esophageal cancer Neg Hx     Social History   Socioeconomic History   Marital status: Single    Spouse name: Not on file   Number of children: Not on file   Years of education: Not on file   Highest education level: Not on file  Occupational History   Not on file  Tobacco Use   Smoking status: Every Day    Packs/day: 0.50    Years: 35.00    Pack years: 17.50    Types: Cigarettes   Smokeless tobacco: Never   Tobacco comments:    smoking .25 - 0.33 ppd  Vaping Use   Vaping Use: Never used  Substance and Sexual Activity   Alcohol use: Yes    Alcohol/week: 10.0 standard drinks    Types: 10 Shots of liquor per week    Comment: liquor   Drug use: No   Sexual activity: Yes  Other Topics Concern   Not on file  Social History Narrative   Not on file   Social Determinants of Health   Financial Resource Strain: Not on file  Food Insecurity: Not on file  Transportation Needs: Not on file  Physical Activity: Not on file  Stress: Not on file  Social Connections: Not on file  Intimate Partner Violence: Not on file    Outpatient Medications Prior to Visit  Medication Sig Dispense Refill   atorvastatin (LIPITOR) 10 MG tablet Take 1 tablet (10 mg total) by mouth daily. 30 tablet 6   fluticasone (FLONASE) 50 MCG/ACT nasal spray Place 2 sprays into both nostrils daily. 16 g 6   sildenafil (VIAGRA) 25 MG tablet Take 1 tablet (25 mg total) by mouth daily as needed for erectile dysfunction. 10 tablet 6   Vitamin D, Ergocalciferol, (DRISDOL) 1.25 MG (50000 UT) CAPS capsule Take 1 capsule (50,000 Units total) by mouth every 7 (seven) days. 5 capsule 6   amLODipine (NORVASC) 5 MG tablet TAKE 1 TABLET (5 MG TOTAL) BY MOUTH DAILY. 30  tablet 6   azelastine (ASTELIN) 0.1 % nasal spray Place 2 sprays into both nostrils daily. Use in each nostril as directed (Patient not taking: No sig reported) 30 mL 3   carvedilol (COREG) 12.5 MG tablet TAKE 1 TABLET (12.5 MG TOTAL) BY MOUTH DAILY. 30 tablet 6   No facility-administered medications prior to visit.    No Known Allergies  ROS Review of Systems    Objective:    Physical Exam Constitutional:      General: He is not in acute distress.    Appearance: He is not toxic-appearing.     Comments: Underweight   HENT:     Head: Normocephalic and atraumatic.     Nose: Nose normal.     Mouth/Throat:     Mouth: Mucous membranes are moist.  Cardiovascular:     Rate and Rhythm: Normal rate and regular rhythm.     Pulses: Normal pulses.     Heart sounds: Normal heart sounds.  Pulmonary:     Effort: Pulmonary effort is normal.     Comments: diminshed Abdominal:     Palpations: Abdomen is soft.  Musculoskeletal:     Right hand: Deformity (5th digit) present. No tenderness or bony tenderness. Decreased strength. Normal capillary refill. Normal pulse.     Cervical back: Normal range of motion.  Skin:    General: Skin is warm.     Capillary Refill: Capillary refill takes less than 2 seconds.  Neurological:     General: No focal deficit present.     Mental Status: He is alert and oriented to person, place, and time.  Psychiatric:        Mood and Affect: Mood normal.        Behavior: Behavior normal.        Thought Content: Thought content normal.        Judgment: Judgment normal.    BP (!) 148/84   Pulse 92   Temp 98.1 F (36.7 C)   Wt 106 lb 3.2 oz (48.2 kg)   SpO2 100%   BMI 16.63 kg/m  Wt Readings from Last 3 Encounters:  05/03/21 106 lb 3.2 oz (48.2 kg)  06/15/20 103 lb 12.8 oz (47.1 kg)  01/26/20 104 lb 9.6 oz (47.4 kg)     There are no preventive care reminders to display for this patient.   There are no preventive care reminders to display for this  patient.  Lab Results  Component Value Date   TSH 0.459 06/15/2020   Lab Results  Component Value Date   WBC 6.4 07/28/2019   HGB 13.1 07/28/2019   HCT 40.3 07/28/2019   MCV 99 (H)  07/28/2019   PLT 248 07/28/2019   Lab Results  Component Value Date   NA 140 06/15/2020   K 4.3 06/15/2020   CO2 25 01/26/2020   GLUCOSE 90 06/15/2020   BUN 10 06/15/2020   CREATININE 1.20 06/15/2020   BILITOT 0.2 06/15/2020   ALKPHOS 82 06/15/2020   AST 33 06/15/2020   ALT 36 01/26/2020   PROT 6.3 06/15/2020   ALBUMIN 3.8 06/15/2020   CALCIUM 9.1 06/15/2020   Lab Results  Component Value Date   CHOL 230 (H) 05/03/2021   Lab Results  Component Value Date   HDL 77 07/28/2019   Lab Results  Component Value Date   LDLCALC 111 (H) 07/28/2019   Lab Results  Component Value Date   TRIG 202 (H) 07/28/2019   Lab Results  Component Value Date   CHOLHDL 2.9 07/28/2019   Lab Results  Component Value Date   HGBA1C 5.0 07/28/2019      Assessment & Plan:   Problem List Items Addressed This Visit       Other   Hyperlipidemia Persistent  Labs pending Heart healthy diet encouraged   Relevant Medications   amLODipine (NORVASC) 5 MG tablet   carvedilol (COREG) 12.5 MG tablet   Other Relevant Orders   Lipid panel (Completed)   Other Visit Diagnoses     Essential hypertension    -  Primary Encouraged on going compliance with current medication regimen Encouraged home monitoring and recording BP <130/80 Eating a heart-healthy diet with less salt Encouraged regular physical activity     Relevant Medications   amLODipine (NORVASC) 5 MG tablet   carvedilol (COREG) 12.5 MG tablet   Other Relevant Orders   Comp. Metabolic Panel (12) (Completed)   Fatigue, unspecified type     Labs pending   Relevant Orders   CBC with Differential/Platelet (Completed)   Trigger little finger of right hand     Worsening  Referral pending for additional treatment options   Relevant Orders    Ambulatory referral to Hand Surgery   Contracture of joint of finger of right hand       Relevant Orders   Ambulatory referral to Hand Surgery       Meds ordered this encounter  Medications   amLODipine (NORVASC) 5 MG tablet    Sig: TAKE 1 TABLET (5 MG TOTAL) BY MOUTH DAILY.    Dispense:  30 tablet    Refill:  6    Order Specific Question:   Supervising Provider    Answer:   Quentin Angst [[id number]]   carvedilol (COREG) 12.5 MG tablet    Sig: TAKE 1 TABLET (12.5 MG TOTAL) BY MOUTH DAILY.    Dispense:  30 tablet    Refill:  6    Order Specific Question:   Supervising Provider    Answer:   Quentin Angst [id number]    Follow-up: Return in about 3 months (around 08/02/2021) for Follow up HTN [id number].    Barbette Merino, NP

## 2021-05-03 NOTE — Patient Instructions (Signed)
Managing Your Hypertension Hypertension, also called high blood pressure, is when the force of the blood pressing against the walls of the arteries is too strong. Arteries are blood vessels that carry blood from your heart throughout your body. Hypertension forces the heart to work harder to pump blood and may cause the arteries tobecome narrow or stiff. Understanding blood pressure readings Your personal target blood pressure may vary depending on your medical conditions, your age, and other factors. A blood pressure reading includes a higher number over a lower number. Ideally, your blood pressure should be below 120/80. You should know that: The first, or top, number is called the systolic pressure. It is a measure of the pressure in your arteries as your heart beats. The second, or bottom number, is called the diastolic pressure. It is a measure of the pressure in your arteries as the heart relaxes. Blood pressure is classified into four stages. Based on your blood pressure reading, your health care provider may use the following stages to determine what type of treatment you need, if any. Systolic pressure and diastolicpressure are measured in a unit called mmHg. Normal Systolic pressure: below 120. Diastolic pressure: below 80. Elevated Systolic pressure: 120-129. Diastolic pressure: below 80. Hypertension stage 1 Systolic pressure: 130-139. Diastolic pressure: 80-89. Hypertension stage 2 Systolic pressure: 140 or above. Diastolic pressure: 90 or above. How can this condition affect me? Managing your hypertension is an important responsibility. Over time, hypertension can damage the arteries and decrease blood flow to important parts of the body, including the brain, heart, and kidneys. Having untreated or uncontrolled hypertension can lead to: A heart attack. A stroke. A weakened blood vessel (aneurysm). Heart failure. Kidney damage. Eye damage. Metabolic syndrome. Memory and  concentration problems. Vascular dementia. What actions can I take to manage this condition? Hypertension can be managed by making lifestyle changes and possibly by taking medicines. Your health care provider will help you make a plan to bring yourblood pressure within a normal range. Nutrition  Eat a diet that is high in fiber and potassium, and low in salt (sodium), added sugar, and fat. An example eating plan is called the Dietary Approaches to Stop Hypertension (DASH) diet. To eat this way: Eat plenty of fresh fruits and vegetables. Try to fill one-half of your plate at each meal with fruits and vegetables. Eat whole grains, such as whole-wheat pasta, brown rice, or whole-grain bread. Fill about one-fourth of your plate with whole grains. Eat low-fat dairy products. Avoid fatty cuts of meat, processed or cured meats, and poultry with skin. Fill about one-fourth of your plate with lean proteins such as fish, chicken without skin, beans, eggs, and tofu. Avoid pre-made and processed foods. These tend to be higher in sodium, added sugar, and fat. Reduce your daily sodium intake. Most people with hypertension should eat less than 1,500 mg of sodium a day.  Lifestyle  Work with your health care provider to maintain a healthy body weight or to lose weight. Ask what an ideal weight is for you. Get at least 30 minutes of exercise that causes your heart to beat faster (aerobic exercise) most days of the week. Activities may include walking, swimming, or biking. Include exercise to strengthen your muscles (resistance exercise), such as weight lifting, as part of your weekly exercise routine. Try to do these types of exercises for 30 minutes at least 3 days a week. Do not use any products that contain nicotine or tobacco, such as cigarettes, e-cigarettes, and chewing   tobacco. If you need help quitting, ask your health care provider. Control any long-term (chronic) conditions you have, such as high  cholesterol or diabetes. Identify your sources of stress and find ways to manage stress. This may include meditation, deep breathing, or making time for fun activities.  Alcohol use Do not drink alcohol if: Your health care provider tells you not to drink. You are pregnant, may be pregnant, or are planning to become pregnant. If you drink alcohol: Limit how much you use to: 0-1 drink a day for women. 0-2 drinks a day for men. Be aware of how much alcohol is in your drink. In the [place] one drink equals one 12 oz bottle of beer (355 mL), one 5 oz glass of wine (148 mL), or one 1 oz glass of hard liquor (44 mL). Medicines Your health care provider may prescribe medicine if lifestyle changes are not enough to get your blood pressure under control and if: Your systolic blood pressure is 130 or higher. Your diastolic blood pressure is 80 or higher. Take medicines only as told by your health care provider. Follow the directions carefully. Blood pressure medicines must be taken as told by your health care provider. The medicine does not work as well when you skip doses. Skippingdoses also puts you at risk for problems. Monitoring Before you monitor your blood pressure: Do not smoke, drink caffeinated beverages, or exercise within 30 minutes before taking a measurement. Use the bathroom and empty your bladder (urinate). Sit quietly for at least 5 minutes before taking measurements. Monitor your blood pressure at home as told by your health care provider. To do this: Sit with your back straight and supported. Place your feet flat on the floor. Do not cross your legs. Support your arm on a flat surface, such as a table. Make sure your upper arm is at heart level. Each time you measure, take two or three readings one minute apart and record the results. You may also need to have your blood pressure checked regularly by your healthcare provider. General information Talk with your health care  provider about your diet, exercise habits, and other lifestyle factors that may be contributing to hypertension. Review all the medicines you take with your health care provider because there may be side effects or interactions. Keep all visits as told by your health care provider. Your health care provider can help you create and adjust your plan for managing your high blood pressure. Where to find more information National Heart, Lung, and Blood Institute: www.nhlbi.nih.gov American Heart Association: www.heart.org Contact a health care provider if: You think you are having a reaction to medicines you have taken. You have repeated (recurrent) headaches. You feel dizzy. You have swelling in your ankles. You have trouble with your vision. Get help right away if: You develop a severe headache or confusion. You have unusual weakness or numbness, or you feel faint. You have severe pain in your chest or abdomen. You vomit repeatedly. You have trouble breathing. These symptoms may represent a serious problem that is an emergency. Do not wait to see if the symptoms will go away. Get medical help right away. Call your local emergency services (911 in the [place] Do not drive yourself to the hospital. Summary Hypertension is when the force of blood pumping through your arteries is too strong. If this condition is not controlled, it may put you at risk for serious complications. Your personal target blood pressure may vary depending on your medical conditions,   your age, and other factors. For most people, a normal blood pressure is less than 120/80. Hypertension is managed by lifestyle changes, medicines, or both. Lifestyle changes to help manage hypertension include losing weight, eating a healthy, low-sodium diet, exercising more, stopping smoking, and limiting alcohol. This information is not intended to replace advice given to you by your health care provider. Make sure you discuss any questions  you have with your healthcare provider. Document Revised: 09/17/2019 Document Reviewed: 07/13/2019 Elsevier Patient Education  2022 [employer]  

## 2021-05-04 LAB — CBC WITH DIFFERENTIAL/PLATELET
Basophils Absolute: 0 10*3/uL (ref 0.0–0.2)
Basos: 0 %
EOS (ABSOLUTE): 0.1 10*3/uL (ref 0.0–0.4)
Eos: 2 %
Hematocrit: 43.8 % (ref 37.5–51.0)
Hemoglobin: 14.7 g/dL (ref 13.0–17.7)
Immature Grans (Abs): 0 10*3/uL (ref 0.0–0.1)
Immature Granulocytes: 0 %
Lymphocytes Absolute: 2.9 10*3/uL (ref 0.7–3.1)
Lymphs: 40 %
MCH: 32.9 pg (ref 26.6–33.0)
MCHC: 33.6 g/dL (ref 31.5–35.7)
MCV: 98 fL — ABNORMAL HIGH (ref 79–97)
Monocytes Absolute: 0.6 10*3/uL (ref 0.1–0.9)
Monocytes: 9 %
Neutrophils Absolute: 3.6 10*3/uL (ref 1.4–7.0)
Neutrophils: 49 %
Platelets: 230 10*3/uL (ref 150–450)
RBC: 4.47 x10E6/uL (ref 4.14–5.80)
RDW: 14.6 % (ref 11.6–15.4)
WBC: 7.2 10*3/uL (ref 3.4–10.8)

## 2021-05-04 LAB — COMP. METABOLIC PANEL (12)
AST: 60 IU/L — ABNORMAL HIGH (ref 0–40)
Albumin/Globulin Ratio: 1.5 (ref 1.2–2.2)
Albumin: 4.7 g/dL (ref 3.8–4.8)
Alkaline Phosphatase: 118 IU/L (ref 44–121)
BUN/Creatinine Ratio: 11 (ref 10–24)
BUN: 16 mg/dL (ref 8–27)
Bilirubin Total: 0.4 mg/dL (ref 0.0–1.2)
Calcium: 9.7 mg/dL (ref 8.6–10.2)
Chloride: 97 mmol/L (ref 96–106)
Creatinine, Ser: 1.43 mg/dL — ABNORMAL HIGH (ref 0.76–1.27)
Globulin, Total: 3.2 g/dL (ref 1.5–4.5)
Glucose: 68 mg/dL (ref 65–99)
Potassium: 4.6 mmol/L (ref 3.5–5.2)
Sodium: 137 mmol/L (ref 134–144)
Total Protein: 7.9 g/dL (ref 6.0–8.5)
eGFR: 55 mL/min/{1.73_m2} — ABNORMAL LOW (ref >59)

## 2021-05-04 LAB — LIPID PANEL
Chol/HDL Ratio: 2.5 ratio (ref 0.0–5.0)
Cholesterol, Total: 230 mg/dL — ABNORMAL HIGH (ref 100–199)
HDL: 91 mg/dL (ref >39)
LDL Chol Calc (NIH): 114 mg/dL — ABNORMAL HIGH (ref 0–99)
Triglycerides: 149 mg/dL (ref 0–149)
VLDL Cholesterol Cal: 25 mg/dL (ref 5–40)

## 2021-05-07 ENCOUNTER — Other Ambulatory Visit: Payer: Self-pay

## 2021-05-08 ENCOUNTER — Other Ambulatory Visit: Payer: Self-pay

## 2021-06-20 ENCOUNTER — Ambulatory Visit: Payer: Self-pay | Admitting: Internal Medicine

## 2021-06-21 ENCOUNTER — Other Ambulatory Visit: Payer: Self-pay

## 2021-06-22 ENCOUNTER — Other Ambulatory Visit: Payer: Self-pay

## 2021-07-02 ENCOUNTER — Other Ambulatory Visit: Payer: Self-pay

## 2021-07-02 ENCOUNTER — Ambulatory Visit (INDEPENDENT_AMBULATORY_CARE_PROVIDER_SITE_OTHER): Payer: Self-pay | Admitting: Nurse Practitioner

## 2021-07-02 ENCOUNTER — Encounter: Payer: Self-pay | Admitting: Nurse Practitioner

## 2021-07-02 VITALS — BP 137/78 | HR 80 | Temp 97.5°F | Ht 67.0 in | Wt 107.0 lb

## 2021-07-02 DIAGNOSIS — R55 Syncope and collapse: Secondary | ICD-10-CM

## 2021-07-02 DIAGNOSIS — E782 Mixed hyperlipidemia: Secondary | ICD-10-CM

## 2021-07-02 DIAGNOSIS — Z131 Encounter for screening for diabetes mellitus: Secondary | ICD-10-CM

## 2021-07-02 DIAGNOSIS — I10 Essential (primary) hypertension: Secondary | ICD-10-CM

## 2021-07-02 LAB — GLUCOSE, POCT (MANUAL RESULT ENTRY): POC Glucose: 105 mg/dl — AB (ref 70–99)

## 2021-07-02 MED ORDER — MOMETASONE FUROATE 50 MCG/ACT NA SUSP
2.0000 | Freq: Every day | NASAL | 12 refills | Status: DC
  Filled 2021-07-02 – 2021-10-02 (×3): qty 17, 30d supply, fill #0

## 2021-07-02 MED ORDER — ATORVASTATIN CALCIUM 10 MG PO TABS
10.0000 mg | ORAL_TABLET | Freq: Every day | ORAL | 6 refills | Status: DC
  Filled 2021-07-02: qty 30, 30d supply, fill #0

## 2021-07-02 NOTE — Progress Notes (Signed)
[facility] Patient [facility] [address] [place], [place]  [address] Phone:  [phone]   Fax:  [phone]    Established Patient Office Visit  Subjective:  Patient ID: George Austin, male    DOB: 07/11/1956  Age: 64 y.o. MRN: [id number]  CC:  Chief Complaint  Patient presents with   Follow-up    ED visit 10/20 seizure.     HPI George Austin presents for follow up. He  has a past medical history of Allergy, Elevated serum creatinine (07/2019), Hyperlipidemia (07/2019), Hypertension, and Vitamin D deficiency (07/2019).   He is in today following up.  He had a syncopal episode on 06/14/2021 while out of town.  He is only seen once a year per patient's preference.  The cause of the syncope is unknown.  He reports that he was told that he has had a stroke in the past per the CT scan.  He is having some weakness. He is has not been referred to neurologist.  He reports that his appetite is up and down. He did have days prior to this that his was not eating as well. He feels full.  He noted nausea prior to arriving at his fathers home.  His current BMI is 16.76. He is currently prescribed amlodipine 5 mg along with carvedilol 12.5 mg daily.  He has been on this regimen for greater than 15 years.  He does continue to smoke cigarettes. Denies headache, dizziness, visual changes, shortness of breath, dyspnea on exertion, chest pain, nausea, vomiting, constipation or any edema. He was not taking the magnesium daily but has started this.   He is taking Flonase for his allergies. He reports that this is no longer effective. He can not even tell that is in use.   Past Medical History:  Diagnosis Date   Allergy    Elevated serum creatinine 07/2019   Hyperlipidemia 07/2019   Hypertension    Vitamin D deficiency 07/2019    Past Surgical History:  Procedure Laterality Date   COLONOSCOPY     over 10 yrs ago in [place], normal per patient 08/30/19   CYST EXCISION Left    thigh    MULTIPLE TOOTH EXTRACTIONS     WISDOM TOOTH EXTRACTION      Family History  Problem Relation Age of Onset   Diabetes Mother    Stomach cancer Mother    Hypertension Father    Diabetes Brother    Colon cancer Neg Hx    Rectal cancer Neg Hx    Esophageal cancer Neg Hx     Social History   Socioeconomic History   Marital status: Single    Spouse name: Not on file   Number of children: Not on file   Years of education: Not on file   Highest education level: Not on file  Occupational History   Not on file  Tobacco Use   Smoking status: Every Day    Packs/day: 0.25    Years: 35.00    Pack years: 8.75    Types: Cigarettes   Smokeless tobacco: Never   Tobacco comments:    smoking .25 - 0.33 ppd  Vaping Use   Vaping Use: Never used  Substance and Sexual Activity   Alcohol use: Yes    Alcohol/week: 10.0 standard drinks    Types: 10 Shots of liquor per week    Comment: liquor   Drug use: No   Sexual activity: Yes  Other Topics Concern  Not on file  Social History Narrative   Not on file   Social Determinants of Health   Financial Resource Strain: Not on file  Food Insecurity: Not on file  Transportation Needs: Not on file  Physical Activity: Not on file  Stress: Not on file  Social Connections: Not on file  Intimate Partner Violence: Not on file    Outpatient Medications Prior to Visit  Medication Sig Dispense Refill   amLODipine (NORVASC) 5 MG tablet TAKE 1 TABLET (5 MG TOTAL) BY MOUTH DAILY. 30 tablet 6   carvedilol (COREG) 12.5 MG tablet TAKE 1 TABLET (12.5 MG TOTAL) BY MOUTH DAILY. 30 tablet 6   Cholecalciferol 25 MCG (1000 UT) tablet Take by mouth.     Magnesium Oxide 250 MG TABS Take by mouth.     sildenafil (VIAGRA) 25 MG tablet Take 1 tablet (25 mg total) by mouth daily as needed for erectile dysfunction. 10 tablet 6   Vitamin D, Ergocalciferol, (DRISDOL) 1.25 MG (50000 UT) CAPS capsule Take 1 capsule (50,000 Units total) by mouth every 7 (seven) days.  5 capsule 6   atorvastatin (LIPITOR) 10 MG tablet Take 1 tablet (10 mg total) by mouth daily. 30 tablet 6   azelastine (ASTELIN) 0.1 % nasal spray Place 2 sprays into both nostrils daily. Use in each nostril as directed 30 mL 3   fluticasone (FLONASE) 50 MCG/ACT nasal spray Place 2 sprays into both nostrils daily. 16 g 6   No facility-administered medications prior to visit.    No Known Allergies  ROS Review of Systems    Objective:    Physical Exam Constitutional:      General: He is not in acute distress.    Comments: underweight  HENT:     Head: Normocephalic and atraumatic.     Nose: Nose normal.     Mouth/Throat:     Mouth: Mucous membranes are moist.  Cardiovascular:     Rate and Rhythm: Regular rhythm. Tachycardia present.  Pulmonary:     Effort: Pulmonary effort is normal.     Breath sounds: Normal breath sounds.  Abdominal:     General: Bowel sounds are normal.  Musculoskeletal:        General: Normal range of motion.     Cervical back: Normal range of motion.     Right lower leg: No edema.     Left lower leg: No edema.  Skin:    General: Skin is warm and dry.     Capillary Refill: Capillary refill takes less than 2 seconds.  Neurological:     General: No focal deficit present.     Mental Status: He is alert and oriented to person, place, and time.  Psychiatric:        Mood and Affect: Mood normal.        Behavior: Behavior normal.        Thought Content: Thought content normal.        Judgment: Judgment normal.    BP 137/78 (BP Location: Right Arm, Patient Position: Sitting)   Pulse 80   Temp (!) 97.5 F (36.4 C)   Ht $R'5\' 7"'tp$  (1.702 m)   Wt 107 lb 0.4 oz (48.5 kg)   SpO2 100%   BMI 16.76 kg/m  Wt Readings from Last 3 Encounters:  07/02/21 107 lb 0.4 oz (48.5 kg)  05/03/21 106 lb 3.2 oz (48.2 kg)  06/15/20 103 lb 12.8 oz (47.1 kg)     Health Maintenance Due  Topic  Date Due   COVID-19 Vaccine (1) Never done    There are no preventive care  reminders to display for this patient.  Lab Results  Component Value Date   TSH 0.760 07/02/2021   Lab Results  Component Value Date   WBC 7.2 05/03/2021   HGB 14.7 05/03/2021   HCT 43.8 05/03/2021   MCV 98 (H) 05/03/2021   PLT 230 05/03/2021   Lab Results  Component Value Date   NA 136 07/02/2021   K 5.1 07/02/2021   CO2 25 01/26/2020   GLUCOSE 89 07/02/2021   BUN 14 07/02/2021   CREATININE 1.42 (H) 07/02/2021   BILITOT 0.5 07/02/2021   ALKPHOS 127 (H) 07/02/2021   AST 68 (H) 07/02/2021   ALT 36 01/26/2020   PROT 8.2 07/02/2021   ALBUMIN 4.8 07/02/2021   CALCIUM 10.4 (H) 07/02/2021   EGFR 56 (L) 07/02/2021   Lab Results  Component Value Date   CHOL 254 (H) 07/02/2021   Lab Results  Component Value Date   HDL 81 07/02/2021   Lab Results  Component Value Date   LDLCALC 156 (H) 07/02/2021   Lab Results  Component Value Date   TRIG 101 07/02/2021   Lab Results  Component Value Date   CHOLHDL 3.1 07/02/2021   Lab Results  Component Value Date   HGBA1C 5.0 07/28/2019      Assessment & Plan:   Problem List Items Addressed This Visit       Other   Hyperlipidemia Persistent  Lab pending Continue with current regimen.   Relevant Medications   atorvastatin (LIPITOR) 10 MG tablet   Other Relevant Orders   Lipid panel (Completed)   Other Visit Diagnoses     Syncope and collapse    -  Primary Referral to neurology for further evaluation    Relevant Orders   Ambulatory referral to Neurology   TSH (Completed)   Essential hypertension     Encouraged on going compliance with current medication regimen Encouraged home monitoring and recording BP <130/80 Eating a heart-healthy diet with less salt Encouraged regular physical activity     Relevant Medications   atorvastatin (LIPITOR) 10 MG tablet   Other Relevant Orders   Comp. Metabolic Panel (12) (Completed)   Screening for diabetes mellitus       Relevant Orders   Glucose (CBG) (Completed)        Meds ordered this encounter  Medications   atorvastatin (LIPITOR) 10 MG tablet    Sig: Take 1 tablet (10 mg total) by mouth daily.    Dispense:  30 tablet    Refill:  6    Order Specific Question:   Supervising Provider    Answer:   Tresa Garter [[id number]]   mometasone (NASONEX) 50 MCG/ACT nasal spray    Sig: Place 2 sprays into the nose daily.    Dispense:  17 g    Refill:  12    Order Specific Question:   Supervising Provider    Answer:   Tresa Garter [[id number]]    Follow-up: Return in about 3 months (around 10/02/2021) for Follow up HTN [id number].    Vevelyn Francois, NP

## 2021-07-02 NOTE — Patient Instructions (Addendum)
Syncope, Adult Syncope is when you pass out or faint for a short time. It is caused by a sudden decrease in blood flow to the brain. This can happen for many reasons. It can sometimes happen when seeing blood, getting a shot (injection), or having pain or strong emotions. Most causes of fainting are not dangerous, but in some cases it can be a sign of a serious medical problem. If you faint, get help right away. Call your local emergency services (911 in the [place] Follow these instructions at home: Watch for any changes in your symptoms. Take these actions to stay safe and help with your symptoms: Knowing when you may be about to faint Signs that you may be about to faint include: Feeling dizzy or light-headed. It may feel like the room is spinning. Feeling weak. Feeling like you may vomit (nauseous). Seeing spots or seeing all white or all black. Having cold, clammy skin. Feeling warm and sweaty. Hearing ringing in the ears. If you start to feel like you might faint, sit or lie down right away. If sitting, lower your head down between your legs. If lying down, raise (elevate) your feet above the level of your heart. Breathe deeply and steadily. Wait until all of the symptoms are gone. Have someone stay with you until you feel better. Medicines Take over-the-counter and prescription medicines only as told by your doctor. If you are taking blood pressure or heart medicine, sit up and stand up slowly. Spend a few minutes getting ready to sit and then stand. This can help you feel less dizzy. Lifestyle Do not drive, use machinery, or play sports until your doctor says it is okay. Do not drink alcohol. Do not smoke or use any products that contain nicotine or tobacco. If you need help quitting, ask your doctor. Avoid hot tubs and saunas. General instructions Talk with your doctor about your symptoms. You may need to have testing to help find the cause. Drink enough fluid to keep your pee  (urine) pale yellow. Avoid standing for a long time. If you must stand for a long time, do movements such as: Moving your legs. Crossing your legs. Flexing and stretching your leg muscles. Squatting. Keep all follow-up visits. Contact a doctor if: You have episodes of near fainting. Get help right away if: You pass out or faint. You hit your head or are injured after fainting. You have any of these symptoms: Fast or uneven heartbeats (palpitations). Pain in your chest, belly, or back. Shortness of breath. You have jerky movements that you cannot control (seizure). You have a very bad headache. You are confused. You have problems with how you see (vision). You are very weak. You have trouble walking. You are bleeding from your mouth or your butt (rectum). You have black or tarry poop (stool). These symptoms may be an emergency. Get help right away. Call your local emergency services (911 in the [place] Do not wait to see if the symptoms will go away. Do not drive yourself to the hospital. Summary Syncope is when you pass out or faint for a short time. It is caused by a sudden decrease in blood flow to the brain. Signs that you may be about to faint include feeling dizzy or light-headed, feeling like you may vomit, seeing all white or all black, or having cold, clammy skin. If you start to feel like you might faint, sit or lie down right away. Lower your head if sitting, or raise (elevate) your  feet if lying down. Breathe deeply and steadily. Wait until all of the symptoms are gone. This information is not intended to replace advice given to you by your health care provider. Make sure you discuss any questions you have with your health care provider. Document Revised: 12/21/2020 Document Reviewed: 12/21/2020 Elsevier Patient Education  2022 [place] Following a healthy eating pattern may help you to achieve and maintain a healthy body weight, reduce the risk of  chronic disease, and live a long and productive life. It is important to follow a healthy eating pattern at an appropriate calorie level for your body. Your nutritional needs should be met primarily through food by choosing a variety of nutrient-rich foods. What are tips for following this plan? Reading food labels Read labels and choose the following: Reduced or low sodium. Juices with 100% fruit juice. Foods with low saturated fats and high polyunsaturated and monounsaturated fats. Foods with whole grains, such as whole wheat, cracked wheat, brown rice, and wild rice. Whole grains that are fortified with folic acid. This is recommended for women who are pregnant or who want to become pregnant. Read labels and avoid the following: Foods with a lot of added sugars. These include foods that contain brown sugar, corn sweetener, corn syrup, dextrose, fructose, glucose, high-fructose corn syrup, honey, invert sugar, lactose, malt syrup, maltose, molasses, raw sugar, sucrose, trehalose, or turbinado sugar. Do not eat more than the following amounts of added sugar per day: 6 teaspoons (25 g) for women. 9 teaspoons (38 g) for men. Foods that contain processed or refined starches and grains. Refined grain products, such as white flour, degermed cornmeal, white bread, and white rice. Shopping Choose nutrient-rich snacks, such as vegetables, whole fruits, and nuts. Avoid high-calorie and high-sugar snacks, such as potato chips, fruit snacks, and candy. Use oil-based dressings and spreads on foods instead of solid fats such as butter, stick margarine, or cream cheese. Limit pre-made sauces, mixes, and "instant" products such as flavored rice, instant noodles, and ready-made pasta. Try more plant-protein sources, such as tofu, tempeh, black beans, edamame, lentils, nuts, and seeds. Explore eating plans such as the Mediterranean diet or vegetarian diet. Cooking Use oil to saut or stir-fry foods instead of  solid fats such as butter, stick margarine, or lard. Try baking, boiling, grilling, or broiling instead of frying. Remove the fatty part of meats before cooking. Steam vegetables in water or broth. Meal planning  At meals, imagine dividing your plate into fourths: One-half of your plate is fruits and vegetables. One-fourth of your plate is whole grains. One-fourth of your plate is protein, especially lean meats, poultry, eggs, tofu, beans, or nuts. Include low-fat dairy as part of your daily diet. Lifestyle Choose healthy options in all settings, including home, work, school, restaurants, or stores. Prepare your food safely: Wash your hands after handling raw meats. Keep food preparation surfaces clean by regularly washing with hot, soapy water. Keep raw meats separate from ready-to-eat foods, such as fruits and vegetables. Cook seafood, meat, poultry, and eggs to the recommended internal temperature. Store foods at safe temperatures. In general: Keep cold foods at 48F (4.4C) or below. Keep hot foods at 148F (60C) or above. Keep your freezer at [facility] (-17.8C) or below. Foods are no longer safe to eat when they have been between the temperatures of 40-148F (4.4-60C) for more than 2 hours. What foods should I eat? Fruits Aim to eat 2 cup-equivalents of fresh, canned (in natural juice), or frozen  fruits each day. Examples of 1 cup-equivalent of fruit include 1 small apple, 8 large strawberries, 1 cup canned fruit,  cup dried fruit, or 1 cup 100% juice. Vegetables Aim to eat 2-3 cup-equivalents of fresh and frozen vegetables each day, including different varieties and colors. Examples of 1 cup-equivalent of vegetables include 2 medium carrots, 2 cups raw, leafy greens, 1 cup chopped vegetable (raw or cooked), or 1 medium baked potato. Grains Aim to eat 6 ounce-equivalents of whole grains each day. Examples of 1 ounce-equivalent of grains include 1 slice of bread, 1 cup ready-to-eat  cereal, 3 cups popcorn, or  cup cooked rice, pasta, or cereal. Meats and other proteins Aim to eat 5-6 ounce-equivalents of protein each day. Examples of 1 ounce-equivalent of protein include 1 egg, 1/2 cup nuts or seeds, or 1 tablespoon (16 g) peanut butter. A cut of meat or fish that is the size of a deck of cards is about 3-4 ounce-equivalents. Of the protein you eat each week, try to have at least 8 ounces come from seafood. This includes salmon, trout, herring, and anchovies. Dairy Aim to eat 3 cup-equivalents of fat-free or low-fat dairy each day. Examples of 1 cup-equivalent of dairy include 1 cup (240 mL) milk, 8 ounces (250 g) yogurt, 1 ounces (44 g) natural cheese, or 1 cup (240 mL) fortified soy milk. Fats and oils Aim for about 5 teaspoons (21 g) per day. Choose monounsaturated fats, such as canola and olive oils, avocados, peanut butter, and most nuts, or polyunsaturated fats, such as sunflower, corn, and soybean oils, walnuts, pine nuts, sesame seeds, sunflower seeds, and flaxseed. Beverages Aim for six 8-oz glasses of water per day. Limit coffee to three to five 8-oz cups per day. Limit caffeinated beverages that have added calories, such as soda and energy drinks. Limit alcohol intake to no more than 1 drink a day for nonpregnant women and 2 drinks a day for men. One drink equals 12 oz of beer (355 mL), 5 oz of wine (148 mL), or 1 oz of hard liquor (44 mL). Seasoning and other foods Avoid adding excess amounts of salt to your foods. Try flavoring foods with herbs and spices instead of salt. Avoid adding sugar to foods. Try using oil-based dressings, sauces, and spreads instead of solid fats. This information is based on general [place] nutrition guidelines. For more information, visit [employer]. Exact amounts may vary based on your nutrition needs. Summary A healthy eating plan may help you to maintain a healthy weight, reduce the risk of chronic diseases, and stay active  throughout your life. Plan your meals. Make sure you eat the right portions of a variety of nutrient-rich foods. Try baking, boiling, grilling, or broiling instead of frying. Choose healthy options in all settings, including home, work, school, restaurants, or stores. This information is not intended to replace advice given to you by your health care provider. Make sure you discuss any questions you have with your health care provider. Document Revised: 04/10/2021 Document Reviewed: 04/10/2021 Elsevier Patient Education  [place].

## 2021-07-03 ENCOUNTER — Other Ambulatory Visit: Payer: Self-pay

## 2021-07-03 LAB — LIPID PANEL
Chol/HDL Ratio: 3.1 ratio (ref 0.0–5.0)
Cholesterol, Total: 254 mg/dL — ABNORMAL HIGH (ref 100–199)
HDL: 81 mg/dL (ref >39)
LDL Chol Calc (NIH): 156 mg/dL — ABNORMAL HIGH (ref 0–99)
Triglycerides: 101 mg/dL (ref 0–149)
VLDL Cholesterol Cal: 17 mg/dL (ref 5–40)

## 2021-07-03 LAB — COMP. METABOLIC PANEL (12)
AST: 68 IU/L — ABNORMAL HIGH (ref 0–40)
Albumin/Globulin Ratio: 1.4 (ref 1.2–2.2)
Albumin: 4.8 g/dL (ref 3.8–4.8)
Alkaline Phosphatase: 127 IU/L — ABNORMAL HIGH (ref 44–121)
BUN/Creatinine Ratio: 10 (ref 10–24)
BUN: 14 mg/dL (ref 8–27)
Bilirubin Total: 0.5 mg/dL (ref 0.0–1.2)
Calcium: 10.4 mg/dL — ABNORMAL HIGH (ref 8.6–10.2)
Chloride: 94 mmol/L — ABNORMAL LOW (ref 96–106)
Creatinine, Ser: 1.42 mg/dL — ABNORMAL HIGH (ref 0.76–1.27)
Globulin, Total: 3.4 g/dL (ref 1.5–4.5)
Glucose: 89 mg/dL (ref 70–99)
Potassium: 5.1 mmol/L (ref 3.5–5.2)
Sodium: 136 mmol/L (ref 134–144)
Total Protein: 8.2 g/dL (ref 6.0–8.5)
eGFR: 56 mL/min/{1.73_m2} — ABNORMAL LOW (ref >59)

## 2021-07-03 LAB — TSH: TSH: 0.76 u[IU]/mL (ref 0.450–4.500)

## 2021-07-06 ENCOUNTER — Encounter: Payer: Self-pay | Admitting: Nurse Practitioner

## 2021-07-10 ENCOUNTER — Encounter: Payer: Self-pay | Admitting: Neurology

## 2021-07-17 ENCOUNTER — Encounter: Payer: Self-pay | Admitting: Neurology

## 2021-07-17 ENCOUNTER — Ambulatory Visit (INDEPENDENT_AMBULATORY_CARE_PROVIDER_SITE_OTHER): Payer: 59 | Admitting: Neurology

## 2021-07-17 ENCOUNTER — Other Ambulatory Visit: Payer: Self-pay

## 2021-07-17 VITALS — BP 116/73 | HR 92 | Ht 67.0 in | Wt 109.0 lb

## 2021-07-17 DIAGNOSIS — R55 Syncope and collapse: Secondary | ICD-10-CM | POA: Diagnosis not present

## 2021-07-17 NOTE — Patient Instructions (Addendum)
Schedule MRI brain without contrast  2. Schedule carotid dopplers  3. Schedule routine EEG  Our office will call with results, if normal, follow-up with PCP.

## 2021-07-17 NOTE — Progress Notes (Signed)
NEUROLOGY CONSULTATION NOTE  George Austin MRN: [id number] DOB: [birth year]  Referring provider: Thad Ranger, NP Primary care provider: Thad Ranger, NP  Reason for consult:  syncope   Thank you for your kind referral of George Austin for consultation of the above symptoms. Although his history is well known to you, please allow me to reiterate it for the purpose of our medical record. He is alone in the office today. Records and images were personally reviewed where available.   HISTORY OF PRESENT ILLNESS: This is a 65 year old left-handed man with a history of hypertension, hyperlipidemia, in his usual state of health until 06/14/2021 when he had an episode of loss of consciousness. He had been moving things out of their father's house and did not get good sleep the night prior. He kept waking up to use the bathroom but woke up feeling fine. He drove to his hometown and as they were taking things out, he recalls walking around the yard and leaning against a gas thank when he felt a little dizzy/lightheaded. His stomach was bothering him. The next thing he knew, he was waking up on the ground with family telling him he passed out twice. There was no associated tongue bite or incontinence, no focal weakness. He recalls feeling weak and sleepy. Records indicate family reported it  looked like he was nodding off/fainting, and was observed to have jerking movements. He then came to as they called his name and rubbed him. He stood up and had a similar event. It was noted his BP was 62/46. He was brought to the ER where head CT did not show any acute changes, there was chronic microvascular disease, multiple lacunar infarcts, one of which in the left basal ganglia is not as well-defined and could be subacute. He still has a little dizziness and feels a little weak now He has mild headaches every now and then, he does not take any medication, he mostly lays down. There is no associated  nausea/vomiting. He denies any staring/unresponsive episodes, no olfactory/gustatory hallucinations, deja vu, rising epigastric sensation, focal numbness/tingling/weakness, myoclonic jerks.  He lives alone. He is retired from working in [employer]. He notes palpitations every now and then and rare shortness of breath. No significant episodes of lightheadedness. He denies any diplopia, dysarthria/dysphagia, neck/back pain, bowel dysfunction. He has difficulty putting weight on despite eating well. He now takes Magnesium and stomach does not bother him like that day. He usually drinks 3 glasses of scotch and had a little alcohol the night prior. He denies missing medications. Recalls drinking coffee, a little water bottle, and had some breakfast that morning. He had a normal birth and early development.  There is no history of febrile convulsions, CNS infections such as meningitis/encephalitis, significant traumatic brain injury, neurosurgical procedures, or family history of seizures.    PAST MEDICAL HISTORY: Past Medical History:  Diagnosis Date   Allergy    Elevated serum creatinine 07/2019   Hyperlipidemia 07/2019   Hypertension    Vitamin D deficiency 07/2019    PAST SURGICAL HISTORY: Past Surgical History:  Procedure Laterality Date   COLONOSCOPY     over 10 yrs ago in [place], normal per patient 08/30/19   CYST EXCISION Left    thigh   MULTIPLE TOOTH EXTRACTIONS     WISDOM TOOTH EXTRACTION      MEDICATIONS: Current Outpatient Medications on File Prior to Visit  Medication Sig Dispense Refill   amLODipine (NORVASC) 5 MG tablet  TAKE 1 TABLET (5 MG TOTAL) BY MOUTH DAILY. 30 tablet 6   atorvastatin (LIPITOR) 10 MG tablet Take 1 tablet (10 mg total) by mouth daily. 30 tablet 6   carvedilol (COREG) 12.5 MG tablet TAKE 1 TABLET (12.5 MG TOTAL) BY MOUTH DAILY. 30 tablet 6   Cholecalciferol 25 MCG (1000 UT) tablet Take by mouth.     Magnesium Oxide 250 MG TABS Take by mouth.      mometasone (NASONEX) 50 MCG/ACT nasal spray Place 2 sprays into the nose daily. 17 g 12   sildenafil (VIAGRA) 25 MG tablet Take 1 tablet (25 mg total) by mouth daily as needed for erectile dysfunction. 10 tablet 6   Vitamin D, Ergocalciferol, (DRISDOL) 1.25 MG (50000 UT) CAPS capsule Take 1 capsule (50,000 Units total) by mouth every 7 (seven) days. 5 capsule 6   No current facility-administered medications on file prior to visit.    ALLERGIES: No Known Allergies  FAMILY HISTORY: Family History  Problem Relation Age of Onset   Diabetes Mother    Stomach cancer Mother    Hypertension Father    Diabetes Brother    Colon cancer Neg Hx    Rectal cancer Neg Hx    Esophageal cancer Neg Hx     SOCIAL HISTORY: Social History   Socioeconomic History   Marital status: Single    Spouse name: Not on file   Number of children: Not on file   Years of education: Not on file   Highest education level: Not on file  Occupational History   Not on file  Tobacco Use   Smoking status: Every Day    Packs/day: 0.25    Years: 35.00    Pack years: 8.75    Types: Cigarettes   Smokeless tobacco: Never   Tobacco comments:    smoking .25 - 0.33 ppd  Vaping Use   Vaping Use: Never used  Substance and Sexual Activity   Alcohol use: Yes    Alcohol/week: 10.0 standard drinks    Types: 10 Shots of liquor per week    Comment: liquor   Drug use: No   Sexual activity: Yes  Other Topics Concern   Not on file  Social History Narrative   Left handed    Social Determinants of Health   Financial Resource Strain: Not on file  Food Insecurity: Not on file  Transportation Needs: Not on file  Physical Activity: Not on file  Stress: Not on file  Social Connections: Not on file  Intimate Partner Violence: Not on file     PHYSICAL EXAM: Vitals:   07/17/21 0854  BP: 116/73  Pulse: 92  SpO2: 99%   General: No acute distress Head:  Normocephalic/atraumatic Skin/Extremities: No rash, no  edema Neurological Exam: Mental status: alert and oriented to person, place, and time, no dysarthria or aphasia, Fund of knowledge is appropriate.  Recent and remote memory are intact.  Attention and concentration are normal.     Cranial nerves: CN I: not tested CN II: pupils equal, round and reactive to light, visual fields intact CN III, IV, VI:  full range of motion, no nystagmus, no ptosis CN V: facial sensation intact CN VII: upper and lower face symmetric CN VIII: hearing intact to conversation Bulk & Tone: normal, no fasciculations. Motor: 5/5 throughout with no pronator drift. Sensation: intact to light touch, cold, pin, vibration and joint position sense.  No extinction to double simultaneous stimulation.  Romberg test negative Deep Tendon Reflexes: +2  throughout Cerebellar: no incoordination on finger to nose testing Gait: narrow-based and steady, able to tandem walk adequately. Tremor: none   IMPRESSION: This is a 65 year old left-handed man with a history of hypertension, hyperlipidemia, presenting for evaluation of syncope. He had 2 episodes of loss of consciousness on 06/14/21 with jerking movements.BP reportedly  62/46. His neurological exam today is non-focal. Symptoms suggestive of vasovagal syncope with convulsive syncope, less likely epileptic seizure. He still feels a little weak and dizzy, we discussed doing MRI brain without contrast, routine EEG, and carotid dopplers. [place] driving laws were discussed with the patient, and he knows to stop driving after a seizure, until 6 months seizure-free. Our office will call with results, if normal, follow-up with PCP.   Thank you for allowing me to participate in the care of this patient. Please do not hesitate to call for any questions or concerns.   Patrcia Dolly, M.D.  CC: Thad Ranger, NP

## 2021-07-23 ENCOUNTER — Other Ambulatory Visit: Payer: Self-pay

## 2021-07-23 ENCOUNTER — Ambulatory Visit (INDEPENDENT_AMBULATORY_CARE_PROVIDER_SITE_OTHER): Payer: 59 | Admitting: Neurology

## 2021-07-23 ENCOUNTER — Ambulatory Visit
Admission: RE | Admit: 2021-07-23 | Discharge: 2021-07-23 | Disposition: A | Discharge status: Home / Self Care | Payer: 59 | Source: Ambulatory Visit | Attending: Neurology | Admitting: Neurology

## 2021-07-23 ENCOUNTER — Telehealth: Payer: Self-pay

## 2021-07-23 DIAGNOSIS — R55 Syncope and collapse: Secondary | ICD-10-CM

## 2021-07-23 NOTE — Telephone Encounter (Signed)
-----   Message from Glendale Chard, DO sent at 07/23/2021 10:52 AM EST ----- Please let pt know that ultrasound of the carotid arteries is normal and does not show any narrowing. Thanks.

## 2021-07-23 NOTE — Telephone Encounter (Signed)
Pt called and informed that ultrasound of the carotid arteries is normal and does not show any narrowing

## 2021-07-24 ENCOUNTER — Other Ambulatory Visit: Payer: 59

## 2021-07-26 ENCOUNTER — Encounter: Payer: Self-pay | Admitting: Neurology

## 2021-08-03 ENCOUNTER — Ambulatory Visit: Payer: Self-pay | Admitting: Nurse Practitioner

## 2021-08-12 ENCOUNTER — Inpatient Hospital Stay: Admission: RE | Admit: 2021-08-12 | Payer: 59 | Source: Ambulatory Visit

## 2021-08-14 ENCOUNTER — Other Ambulatory Visit: Payer: Self-pay

## 2021-08-14 ENCOUNTER — Ambulatory Visit
Admission: RE | Admit: 2021-08-14 | Discharge: 2021-08-14 | Disposition: A | Discharge status: Home / Self Care | Payer: 59 | Source: Ambulatory Visit | Attending: Neurology | Admitting: Neurology

## 2021-08-14 DIAGNOSIS — R55 Syncope and collapse: Secondary | ICD-10-CM

## 2021-08-14 NOTE — Procedures (Signed)
ELECTROENCEPHALOGRAM REPORT  Date of Study: 07/23/2021  Patient's Name: George Austin MRN: [id number] Date of Birth: 04-30-1956  Referring Provider: Dr. Patrcia Dolly  Clinical History: This is a 65 year old man with an episode of loss of consciousness with jerking movements in 05/2021. EEG for classification.  Medications: NORVASC 5 MG tablet LIPITOR 10 MG tablet COREG 12.5 MG tablet Cholecalciferol 25 MCG (1000 UT) tablet Magnesium Oxide 250 MG TABS NASONEX 50 MCG/ACT nasal spray VIAGRA 25 MG tablet DRISDOL 1.25 MG (50000 UT) CAPS capsule  Technical Summary: A multichannel digital EEG recording measured by the international 10-20 system with electrodes applied with paste and impedances below 5000 ohms performed in our laboratory with EKG monitoring in an awake and asleep patient.  Hyperventilation was not performed. Photic stimulation was performed.  The digital EEG was referentially recorded, reformatted, and digitally filtered in a variety of bipolar and referential montages for optimal display.    Description: The patient is awake and asleep during the recording.  During maximal wakefulness, there is a symmetric, medium voltage 9.5 Hz posterior dominant rhythm that attenuates with eye opening.  The record is symmetric.  During drowsiness and sleep, there is an increase in theta slowing of the background, at times sharply contoured over the bilateral temporal regions without clear epileptogenic potential.  Vertex waves and symmetric sleep spindles were seen.  Photic stimulation did not elicit any abnormalities.  There were no epileptiform discharges or electrographic seizures seen.    EKG lead was unremarkable.  Impression: This awake and asleep EEG is normal.    Clinical Correlation: A normal EEG does not exclude a clinical diagnosis of epilepsy.  If further clinical questions remain, prolonged EEG may be helpful.  Clinical correlation is advised.   Patrcia Dolly, M.D.

## 2021-08-15 ENCOUNTER — Telehealth: Payer: Self-pay

## 2021-08-15 NOTE — Telephone Encounter (Signed)
Pt called an informed brain MRI did not show any new changes, no tumor, stroke, or bleed. There is some hardening of the small blood vessels in the brain seen in patients with blood pressure and cholesterol issues, very important to continue control of these conditions. His EEG was also normal. F/u with PCP

## 2021-08-15 NOTE — Telephone Encounter (Signed)
-----   Message from Van Clines, MD sent at 08/15/2021 11:23 AM EST ----- Pls let him know the brain MRI did not show any new changes, no tumor, stroke, or bleed. There is some hardening of the small blood vessels in the brain seen in patients with blood pressure and cholesterol issues, very important to continue control of these conditions. His EEG was also normal. F/u with PCP. Thanks

## 2021-08-31 ENCOUNTER — Other Ambulatory Visit: Payer: Self-pay

## 2021-09-01 ENCOUNTER — Other Ambulatory Visit: Payer: Self-pay

## 2021-09-03 ENCOUNTER — Other Ambulatory Visit: Payer: Self-pay

## 2021-09-11 ENCOUNTER — Other Ambulatory Visit: Payer: Self-pay

## 2021-09-11 ENCOUNTER — Encounter: Payer: Self-pay | Admitting: Internal Medicine

## 2021-09-11 ENCOUNTER — Ambulatory Visit: Payer: 59 | Admitting: Internal Medicine

## 2021-09-11 VITALS — BP 126/84 | HR 80 | Temp 98.0°F | Ht 67.0 in | Wt 111.0 lb

## 2021-09-11 DIAGNOSIS — N1832 Chronic kidney disease, stage 3b: Secondary | ICD-10-CM | POA: Insufficient documentation

## 2021-09-11 DIAGNOSIS — R972 Elevated prostate specific antigen [PSA]: Secondary | ICD-10-CM | POA: Diagnosis not present

## 2021-09-11 DIAGNOSIS — I10 Essential (primary) hypertension: Secondary | ICD-10-CM

## 2021-09-11 DIAGNOSIS — Z0000 Encounter for general adult medical examination without abnormal findings: Secondary | ICD-10-CM | POA: Diagnosis not present

## 2021-09-11 DIAGNOSIS — Z23 Encounter for immunization: Secondary | ICD-10-CM

## 2021-09-11 DIAGNOSIS — R7989 Other specified abnormal findings of blood chemistry: Secondary | ICD-10-CM | POA: Diagnosis not present

## 2021-09-11 DIAGNOSIS — Z125 Encounter for screening for malignant neoplasm of prostate: Secondary | ICD-10-CM | POA: Diagnosis not present

## 2021-09-11 DIAGNOSIS — F17200 Nicotine dependence, unspecified, uncomplicated: Secondary | ICD-10-CM

## 2021-09-11 DIAGNOSIS — E785 Hyperlipidemia, unspecified: Secondary | ICD-10-CM

## 2021-09-11 DIAGNOSIS — Z0001 Encounter for general adult medical examination with abnormal findings: Secondary | ICD-10-CM | POA: Insufficient documentation

## 2021-09-11 LAB — LIPID PANEL
Cholesterol: 223 mg/dL — ABNORMAL HIGH (ref 0–200)
HDL: 81.6 mg/dL (ref >39.00)
LDL Cholesterol: 122 mg/dL — ABNORMAL HIGH (ref 0–99)
NonHDL: 141.44
Total CHOL/HDL Ratio: 3
Triglycerides: 98 mg/dL (ref 0.0–149.0)
VLDL: 19.6 mg/dL (ref 0.0–40.0)

## 2021-09-11 LAB — HEPATIC FUNCTION PANEL
ALT: 24 U/L (ref 0–53)
AST: 47 U/L — ABNORMAL HIGH (ref 0–37)
Albumin: 4.5 g/dL (ref 3.5–5.2)
Alkaline Phosphatase: 95 U/L (ref 39–117)
Bilirubin, Direct: 0.1 mg/dL (ref 0.0–0.3)
Total Bilirubin: 0.5 mg/dL (ref 0.2–1.2)
Total Protein: 7.8 g/dL (ref 6.0–8.3)

## 2021-09-11 LAB — URINALYSIS, ROUTINE W REFLEX MICROSCOPIC
Bilirubin Urine: NEGATIVE
Hgb urine dipstick: NEGATIVE
Leukocytes,Ua: NEGATIVE
Nitrite: NEGATIVE
Specific Gravity, Urine: 1.015 (ref 1.000–1.030)
Total Protein, Urine: 100 — AB
Urine Glucose: NEGATIVE
Urobilinogen, UA: 0.2 (ref 0.0–1.0)
pH: 6.5 (ref 5.0–8.0)

## 2021-09-11 LAB — BASIC METABOLIC PANEL
BUN: 7 mg/dL (ref 6–23)
CO2: 28 mEq/L (ref 19–32)
Calcium: 9.7 mg/dL (ref 8.4–10.5)
Chloride: 100 mEq/L (ref 96–112)
Creatinine, Ser: 1.36 mg/dL (ref 0.40–1.50)
GFR: 55.41 mL/min — ABNORMAL LOW (ref >60.00)
Glucose, Bld: 98 mg/dL (ref 70–99)
Potassium: 4.4 mEq/L (ref 3.5–5.1)
Sodium: 139 mEq/L (ref 135–145)

## 2021-09-11 LAB — PSA: PSA: 9.2 ng/mL — ABNORMAL HIGH (ref 0.10–4.00)

## 2021-09-11 LAB — PROTIME-INR
INR: 0.9 ratio (ref 0.8–1.0)
Prothrombin Time: 9.6 s (ref 9.6–13.1)

## 2021-09-11 NOTE — Patient Instructions (Signed)

## 2021-09-11 NOTE — Progress Notes (Signed)
Subjective:  Patient ID: George Austin, male    DOB: [birth year]  Age: 66 y.o. MRN: [id number]  CC: Annual Exam, Hypertension, and Hyperlipidemia  This visit occurred during the SARS-CoV-2 public health emergency.  Safety protocols were in place, including screening questions prior to the visit, additional usage of staff PPE, and extensive cleaning of exam room while observing appropriate contact time as indicated for disinfecting solutions.    HPI George Austin presents for a CPX and to establish.  He had a syncopal episode about 3 months ago.  He has been thoroughly evaluated since then and has had no more syncopal episodes.  It sounds like he has some complications related to alcohol use.  He has a prior history of DUI and tells me that he drinks about 3 shots of scotch each day.  He is active and denies chest pain, shortness of breath, dizziness, lightheadedness, or diaphoresis.  History George Austin has a past medical history of Allergy, Elevated serum creatinine (07/2019), Hyperlipidemia (07/2019), Hypertension, and Vitamin D deficiency (07/2019).   He has a past surgical history that includes Cyst excision (Left); Multiple tooth extractions; Wisdom tooth extraction; and Colonoscopy.   His family history includes Diabetes in his brother and mother; Hypertension in his father; Lupus in his sister; Stomach cancer in his mother; Stroke in his brother and sister.He reports that he has been smoking cigarettes. He has a 8.75 pack-year smoking history. He has never used smokeless tobacco. He reports current alcohol use of about 21.0 standard drinks per week. He reports that he does not use drugs.  Outpatient Medications Prior to Visit  Medication Sig Dispense Refill   amLODipine (NORVASC) 5 MG tablet TAKE 1 TABLET (5 MG TOTAL) BY MOUTH DAILY. 30 tablet 6   atorvastatin (LIPITOR) 10 MG tablet Take 1 tablet (10 mg total) by mouth daily. 30 tablet 6   Cholecalciferol 25 MCG (1000 UT) tablet Take by  mouth.     Magnesium Oxide 250 MG TABS Take by mouth.     mometasone (NASONEX) 50 MCG/ACT nasal spray Place 2 sprays into the nose daily. 17 g 12   sildenafil (VIAGRA) 25 MG tablet Take 1 tablet (25 mg total) by mouth daily as needed for erectile dysfunction. 10 tablet 6   Vitamin D, Ergocalciferol, (DRISDOL) 1.25 MG (50000 UT) CAPS capsule Take 1 capsule (50,000 Units total) by mouth every 7 (seven) days. 5 capsule 6   carvedilol (COREG) 12.5 MG tablet TAKE 1 TABLET (12.5 MG TOTAL) BY MOUTH DAILY. 30 tablet 6   No facility-administered medications prior to visit.    ROS Review of Systems  Constitutional:  Negative for chills, diaphoresis, fatigue and fever.  HENT: Negative.  Negative for sore throat and trouble swallowing.   Eyes: Negative.   Respiratory:  Negative for cough, chest tightness, shortness of breath and wheezing.   Cardiovascular:  Negative for chest pain, palpitations and leg swelling.  Genitourinary: Negative.  Negative for difficulty urinating, penile pain and penile swelling.  Musculoskeletal: Negative.   Skin: Negative.   Hematological:  Negative for adenopathy. Does not bruise/bleed easily.  Psychiatric/Behavioral: Negative.     Objective:  BP 126/84 (BP Location: Right Arm, Patient Position: Sitting, Cuff Size: Normal)    Pulse 80    Temp 98 F (36.7 C) (Oral)    Ht 5\' 7"  (1.702 m)    Wt 111 lb (50.3 kg)    SpO2 97%    BMI 17.39 kg/m   Physical Exam Vitals reviewed.  Constitutional:      Appearance: Normal appearance.  HENT:     Nose: Nose normal.     Mouth/Throat:     Mouth: Mucous membranes are moist.  Eyes:     General: No scleral icterus.    Conjunctiva/sclera: Conjunctivae normal.     Pupils: Pupils are equal, round, and reactive to light.  Cardiovascular:     Rate and Rhythm: Normal rate and regular rhythm.     Pulses:          Carotid pulses are 1+ on the right side and 1+ on the left side.      Radial pulses are 1+ on the right side and 1+ on  the left side.       Femoral pulses are 1+ on the right side and 1+ on the left side.      Popliteal pulses are 1+ on the right side and 1+ on the left side.       Dorsalis pedis pulses are 1+ on the right side and 1+ on the left side.       Posterior tibial pulses are 1+ on the right side and 1+ on the left side.     Heart sounds: No murmur heard. Pulmonary:     Effort: Pulmonary effort is normal.     Breath sounds: No stridor. No wheezing, rhonchi or rales.  Abdominal:     General: Abdomen is flat. There is no distension.     Palpations: There is no mass.     Tenderness: There is no abdominal tenderness. There is no guarding.     Hernia: There is no hernia in the left inguinal area or right inguinal area.  Genitourinary:    Pubic Area: No rash.      Penis: Normal and circumcised.      Testes: Normal.     Epididymis:     Right: Normal.     Left: Normal.     Prostate: Normal. Not enlarged, not tender and no nodules present.     Rectum: Guaiac result negative. Internal hemorrhoid present. No mass, tenderness, anal fissure or external hemorrhoid. Normal anal tone.  Musculoskeletal:     Cervical back: Neck supple.  Lymphadenopathy:     Cervical: No cervical adenopathy.     Lower Body: No right inguinal adenopathy. No left inguinal adenopathy.  Skin:    General: Skin is warm and dry.     Findings: No rash.  Neurological:     General: No focal deficit present.     Mental Status: He is alert.     Motor: Weakness and atrophy present.     Coordination: Coordination normal.     Gait: Gait normal.     Deep Tendon Reflexes: Reflexes normal.     Comments: Diffuse muscle atrophy  Psychiatric:        Mood and Affect: Mood normal.        Behavior: Behavior normal.    Lab Results  Component Value Date   WBC 7.2 05/03/2021   HGB 14.7 05/03/2021   HCT 43.8 05/03/2021   PLT 230 05/03/2021   GLUCOSE 98 09/11/2021   CHOL 223 (H) 09/11/2021   TRIG 98.0 09/11/2021   HDL 81.60 09/11/2021    LDLCALC 122 (H) 09/11/2021   ALT 24 09/11/2021   AST 47 (H) 09/11/2021   NA 139 09/11/2021   K 4.4 09/11/2021   CL 100 09/11/2021   CREATININE 1.36 09/11/2021   BUN 7 09/11/2021  CO2 28 09/11/2021   TSH 0.760 07/02/2021   PSA 9.20 (H) 09/11/2021   INR 0.9 09/11/2021   HGBA1C 5.0 07/28/2019     Assessment & Plan:   His blood pressure is adequately well controlled.  George Austin was seen today for annual exam, hypertension and hyperlipidemia.  Diagnoses and all orders for this visit:  Primary hypertension- His BP is well controlled. -     Urinalysis, Routine w reflex microscopic; Future -     Basic metabolic panel; Future -     Basic metabolic panel -     Urinalysis, Routine w reflex microscopic  Elevated LFTs- His liver enzymes remain mildly elevated.  His MELD score is reassuring.  Screening for viral hepatitis is negative.  I recommended that he get vaccinated against hepatitis A and B. -     Hepatic function panel; Future -     Protime-INR; Future -     Hepatitis B surface antigen; Future -     Hepatitis B surface antibody,quantitative; Future -     Hepatitis A antibody, total; Future -     Hepatitis B core antibody, total; Future -     Hepatitis B core antibody, total -     Hepatitis A antibody, total -     Hepatitis B surface antibody,quantitative -     Hepatitis B surface antigen -     Protime-INR -     Hepatic function panel  Encounter for general adult medical examination with abnormal findings- Exam completed, labs reviewed, vaccines reviewed: He refused a flu vaccine, patient education material was given. -     Lipid panel; Future -     PSA; Future -     PSA -     Lipid panel  Tobacco use disorder -     Ambulatory Referral for Lung Cancer Scre  Need for vaccination -     Varicella-zoster vaccine IM (Shingrix)  Stage 3b chronic kidney disease ([place]) -     Ambulatory referral to Nephrology  PSA elevation -     Ambulatory referral to  Urology  Hyperlipidemia LDL goal <130- LDL goal achieved. Doing well on the statin    I have discontinued Harlan Stains. George Austin's carvedilol. I am also having him maintain his sildenafil, Vitamin D (Ergocalciferol), amLODipine, Cholecalciferol, Magnesium Oxide, atorvastatin, and mometasone.  No orders of the defined types were placed in this encounter.    Follow-up: Return in about 3 months (around 12/10/2021).  Scarlette Calico, MD

## 2021-09-12 LAB — HEPATITIS A ANTIBODY, TOTAL: Hepatitis A AB,Total: NONREACTIVE

## 2021-09-12 LAB — HEPATITIS B CORE ANTIBODY, TOTAL: Hep B Core Total Ab: NONREACTIVE

## 2021-09-12 LAB — HEPATITIS B SURFACE ANTIBODY, QUANTITATIVE: Hep B S AB Quant (Post): <5 m[IU]/mL — ABNORMAL LOW (ref >=10)

## 2021-09-12 LAB — HEPATITIS B SURFACE ANTIGEN: Hepatitis B Surface Ag: NONREACTIVE

## 2021-09-15 ENCOUNTER — Encounter: Payer: Self-pay | Admitting: Internal Medicine

## 2021-09-16 ENCOUNTER — Encounter: Payer: Self-pay | Admitting: Internal Medicine

## 2021-09-16 DIAGNOSIS — E785 Hyperlipidemia, unspecified: Secondary | ICD-10-CM | POA: Insufficient documentation

## 2021-10-01 ENCOUNTER — Ambulatory Visit: Payer: 59 | Admitting: Nurse Practitioner

## 2021-10-02 ENCOUNTER — Other Ambulatory Visit: Payer: Self-pay

## 2021-10-03 ENCOUNTER — Telehealth: Payer: Self-pay

## 2021-10-03 ENCOUNTER — Telehealth: Payer: Self-pay | Admitting: Internal Medicine

## 2021-10-03 NOTE — Telephone Encounter (Signed)
Carvedilol  Pt said the med has been cancelled

## 2021-10-03 NOTE — Telephone Encounter (Signed)
Pt states he was prescribed carvedilol by previous provider, pt states he was informed medication was discontinued by current provider  Informed pt I was unable to locate current provider discontinuing medication  Pt requesting a call back to discuss why medication was discontinued and if an alternative will be prescribed

## 2021-10-05 ENCOUNTER — Other Ambulatory Visit: Payer: Self-pay | Admitting: Internal Medicine

## 2021-10-05 ENCOUNTER — Other Ambulatory Visit: Payer: Self-pay

## 2021-10-05 NOTE — Telephone Encounter (Signed)
Look like the patient will being seeing another PCP in April.

## 2021-10-05 NOTE — Telephone Encounter (Signed)
Pt checking status of return call, pt inquiring if an alternative bp medication can be prescribed  Please call pt

## 2021-10-05 NOTE — Telephone Encounter (Signed)
Pt has been informed and expressed understanding.  

## 2021-10-15 ENCOUNTER — Other Ambulatory Visit: Payer: Self-pay

## 2021-10-16 ENCOUNTER — Other Ambulatory Visit: Payer: Self-pay

## 2021-10-29 ENCOUNTER — Telehealth: Payer: Self-pay | Admitting: Acute Care

## 2021-10-29 NOTE — Telephone Encounter (Signed)
Contacted patient today regarding lung cancer screening referral.  Patient  explained that he has 'always been a very light smoker' and states he smokes usually 1/4 to 1/3 of a pack of cigarettes per day.  He does not qualify per pack years for lung cancer screening.  He states he never smokes a 1/2 pack per day as a 1/2 pack last several days for him and he has never smoked more than this.  Patient is not eligible for LCS LDCT.  Patient could have CT chest wo contrast if referring provider recommends this for the patient.  Will route this note to PCP.  Referral has been closed. ?

## 2021-11-05 ENCOUNTER — Telehealth: Payer: Self-pay

## 2021-11-05 NOTE — Telephone Encounter (Signed)
Pt called in to update the Referral for urology. Pt would like referral to go [facility] urology [phone]. Would prefer Dr. Apolinar Junes but open to seeing first available. ?

## 2021-11-06 ENCOUNTER — Ambulatory Visit: Payer: Self-pay

## 2021-11-06 ENCOUNTER — Encounter: Payer: Self-pay | Admitting: Orthopedic Surgery

## 2021-11-06 ENCOUNTER — Ambulatory Visit: Payer: 59 | Admitting: Orthopedic Surgery

## 2021-11-06 ENCOUNTER — Other Ambulatory Visit: Payer: Self-pay

## 2021-11-06 VITALS — BP 157/79 | HR 117 | Ht 67.0 in | Wt 111.0 lb

## 2021-11-06 DIAGNOSIS — M720 Palmar fascial fibromatosis [Dupuytren]: Secondary | ICD-10-CM

## 2021-11-20 ENCOUNTER — Other Ambulatory Visit: Payer: Self-pay

## 2021-11-22 ENCOUNTER — Other Ambulatory Visit: Payer: Self-pay

## 2021-11-22 ENCOUNTER — Telehealth: Payer: Self-pay

## 2021-11-22 NOTE — Telephone Encounter (Signed)
Patient called asking if Dr.Benfield has found anything out about his right hand. He stated that it has been about 2 weeks since he has been in the office.  ? ?Please advise ?  ?

## 2021-12-07 ENCOUNTER — Encounter: Payer: Self-pay | Admitting: Urology

## 2021-12-07 ENCOUNTER — Ambulatory Visit (INDEPENDENT_AMBULATORY_CARE_PROVIDER_SITE_OTHER): Payer: 59 | Admitting: Urology

## 2021-12-07 ENCOUNTER — Other Ambulatory Visit: Payer: Self-pay

## 2021-12-07 VITALS — BP 128/75 | HR 102 | Ht 67.0 in | Wt 110.0 lb

## 2021-12-07 DIAGNOSIS — R972 Elevated prostate specific antigen [PSA]: Secondary | ICD-10-CM

## 2021-12-07 LAB — PSA: PSA: 8.4

## 2021-12-07 NOTE — Progress Notes (Signed)
? ?12/07/2021 ?9:35 AM  ? ?Walid Lamberti Sole ?1956/02/17 ?[id number] ? ?Referring provider: Janith Lima, MD ?[place] [address] ? ?Chief Complaint  ?Patient presents with  ? Elevated PSA  ? ? ?HPI: ?George Austin is a 66 y.o. male referred for evaluation of an elevated PSA. ? ?PSA 09/11/2021 9.20 ?Previous PSA 4 years prior 2.3 and 6 years ago was 2.16 ?No bothersome LUTS ?Family history of prostate cancer in father ?Denies dysuria, gross hematuria or recurrent UTI ? ? ?PMH: ?Past Medical History:  ?Diagnosis Date  ? Allergy   ? Elevated serum creatinine 07/2019  ? Hyperlipidemia 07/2019  ? Hypertension   ? Vitamin D deficiency 07/2019  ? ? ?Surgical History: ?Past Surgical History:  ?Procedure Laterality Date  ? COLONOSCOPY    ? over 10 yrs ago in [place], normal per patient 08/30/19  ? CYST EXCISION Left   ? thigh  ? MULTIPLE TOOTH EXTRACTIONS    ? WISDOM TOOTH EXTRACTION    ? ? ?Home Medications:  ?Allergies as of 12/07/2021   ?No Known Allergies ?  ? ?  ?Medication List  ?  ? ?  ? Accurate as of December 07, 2021  9:35 AM. If you have any questions, ask your nurse or doctor.  ?  ?  ? ?  ? ?amLODipine 5 MG tablet ?Commonly known as: NORVASC ?TAKE 1 TABLET (5 MG TOTAL) BY MOUTH DAILY. ?  ?atorvastatin 10 MG tablet ?Commonly known as: LIPITOR ?Take 1 tablet (10 mg total) by mouth daily. ?  ?Cholecalciferol 25 MCG (1000 UT) tablet ?Take by mouth. ?  ?Magnesium Oxide 250 MG Tabs ?Take by mouth. ?  ?mometasone 50 MCG/ACT nasal spray ?Commonly known as: Nasonex ?Place 2 sprays into the nose daily. ?  ?sildenafil 25 MG tablet ?Commonly known as: VIAGRA ?Take 1 tablet (25 mg total) by mouth daily as needed for erectile dysfunction. ?  ?Vitamin D (Ergocalciferol) 1.25 MG (50000 UNIT) Caps capsule ?Commonly known as: DRISDOL ?Take 1 capsule (50,000 Units total) by mouth every 7 (seven) days. ?  ? ?  ? ? ?Allergies: No Known Allergies ? ?Family History: ?Family History  ?Problem Relation Age of Onset  ?  Diabetes Mother   ? Stomach cancer Mother   ? Hypertension Father   ? Stroke Sister   ? Lupus Sister   ? Stroke Brother   ? Diabetes Brother   ? Colon cancer Neg Hx   ? Rectal cancer Neg Hx   ? Esophageal cancer Neg Hx   ? ? ?Social History:  reports that he has been smoking cigarettes. He has a 8.75 pack-year smoking history. He has never used smokeless tobacco. He reports current alcohol use of about 21.0 standard drinks per week. He reports that he does not use drugs. ? ? ?Physical Exam: ?BP 128/75   Pulse (!) 102   Ht 5\' 7"  (1.702 m)   Wt 110 lb (49.9 kg)   BMI 17.23 kg/m?   ?Constitutional:  Alert and oriented, No acute distress. ?HEENT: [place] AT, moist mucus membranes.  Trachea midline, no masses. ?Cardiovascular: No clubbing, cyanosis, or edema. ?Respiratory: Normal respiratory effort, no increased work of breathing. ?GU: Prostate 40 g, flat, smooth without nodules or induration ?Skin: No rashes, bruises or suspicious lesions. ?Neurologic: Grossly intact, no focal deficits, moving all 4 extremities. ?Psychiatric: Normal mood and affect. ? ?Laboratory Data: ? ?Lab Results  ?Component Value Date  ? PSA 9.20 (H) 09/11/2021  ? PSA  2.3 07/08/2017  ? PSA 2.16 01/18/2015  ? ? ?Assessment & Plan:   ? ?1.  Elevated PSA ?Benign DRE ?Although PSA is a prostate cancer screening test he was informed that cancer is not the most common cause of an elevated PSA. Other potential causes including BPH and inflammation were discussed. He was informed that the only way to adequately diagnose prostate cancer would be a transrectal ultrasound and biopsy of the prostate. The procedure was discussed including potential risks of bleeding and infection/sepsis. He was also informed that a negative biopsy does not conclusively rule out the possibility that prostate cancer may be present and that continued monitoring is required. The use of newer adjunctive blood tests including 4kScore were discussed. The use of multiparametric prostate  MRI to evaluate for lesions suspicious for high-grade cancer and aid in targeted biopsy was reviewed.  Continued periodic surveillance was also discussed though if cancer was present there could be a delay in diagnosis and spread. ?PSA has not been repeated and initially recommended repeat PSA today to make sure this was not a transient elevation related inflammation ?He will think over the above options and will be notified with the PSA results ? ? ? ?Abbie Sons, MD ? ?[facility] ?[address] ?[place], [place] [address] ?[phone] ? ?

## 2021-12-08 LAB — PSA: Prostate Specific Ag, Serum: 8.4 ng/mL — ABNORMAL HIGH (ref 0.0–4.0)

## 2021-12-09 ENCOUNTER — Telehealth: Payer: Self-pay | Admitting: Urology

## 2021-12-09 DIAGNOSIS — R972 Elevated prostate specific antigen [PSA]: Secondary | ICD-10-CM

## 2021-12-09 NOTE — Telephone Encounter (Signed)
Repeat PSA remains elevated at 8.4.  We discussed options of prostate biopsy and prostate MRI.  Does he have a preference?  Please let me know if he has any questions ?

## 2021-12-10 ENCOUNTER — Encounter: Payer: Self-pay | Admitting: *Deleted

## 2021-12-10 NOTE — Telephone Encounter (Signed)
Patient would like to do the prostate MRI.  ?

## 2021-12-11 NOTE — Addendum Note (Signed)
Addended by: Riki Altes on: 12/11/2021 09:32 AM ? ? Modules accepted: Orders ? ?

## 2021-12-12 ENCOUNTER — Ambulatory Visit (INDEPENDENT_AMBULATORY_CARE_PROVIDER_SITE_OTHER): Payer: 59 | Admitting: Internal Medicine

## 2021-12-12 ENCOUNTER — Encounter: Payer: Self-pay | Admitting: Internal Medicine

## 2021-12-12 VITALS — BP 136/86 | HR 80 | Temp 97.8°F | Resp 16 | Ht 67.0 in | Wt 111.2 lb

## 2021-12-12 DIAGNOSIS — Z23 Encounter for immunization: Secondary | ICD-10-CM

## 2021-12-12 DIAGNOSIS — R7989 Other specified abnormal findings of blood chemistry: Secondary | ICD-10-CM | POA: Diagnosis not present

## 2021-12-12 DIAGNOSIS — I10 Essential (primary) hypertension: Secondary | ICD-10-CM | POA: Diagnosis not present

## 2021-12-12 DIAGNOSIS — F17200 Nicotine dependence, unspecified, uncomplicated: Secondary | ICD-10-CM

## 2021-12-12 DIAGNOSIS — N1832 Chronic kidney disease, stage 3b: Secondary | ICD-10-CM

## 2021-12-12 LAB — URINALYSIS, ROUTINE W REFLEX MICROSCOPIC
Bilirubin Urine: NEGATIVE
Hgb urine dipstick: NEGATIVE
Ketones, ur: NEGATIVE
Leukocytes,Ua: NEGATIVE
Nitrite: NEGATIVE
RBC / HPF: NONE SEEN (ref 0–?)
Specific Gravity, Urine: 1.01 (ref 1.000–1.030)
Total Protein, Urine: 30 — AB
Urine Glucose: NEGATIVE
Urobilinogen, UA: 0.2 (ref 0.0–1.0)
pH: 6 (ref 5.0–8.0)

## 2021-12-12 LAB — HEPATIC FUNCTION PANEL
ALT: 19 U/L (ref 0–53)
AST: 37 U/L (ref 0–37)
Albumin: 4.1 g/dL (ref 3.5–5.2)
Alkaline Phosphatase: 90 U/L (ref 39–117)
Bilirubin, Direct: 0.1 mg/dL (ref 0.0–0.3)
Total Bilirubin: 0.4 mg/dL (ref 0.2–1.2)
Total Protein: 7.6 g/dL (ref 6.0–8.3)

## 2021-12-12 LAB — BASIC METABOLIC PANEL
BUN: 10 mg/dL (ref 6–23)
CO2: 27 mEq/L (ref 19–32)
Calcium: 9.5 mg/dL (ref 8.4–10.5)
Chloride: 96 mEq/L (ref 96–112)
Creatinine, Ser: 1.28 mg/dL (ref 0.40–1.50)
GFR: 59.48 mL/min — ABNORMAL LOW (ref >60.00)
Glucose, Bld: 86 mg/dL (ref 70–99)
Potassium: 4 mEq/L (ref 3.5–5.1)
Sodium: 134 mEq/L — ABNORMAL LOW (ref 135–145)

## 2021-12-12 NOTE — Patient Instructions (Signed)
Hypertension, Adult High blood pressure (hypertension) is when the force of blood pumping through the arteries is too strong. The arteries are the blood vessels that carry blood from the heart throughout the body. Hypertension forces the heart to work harder to pump blood and may cause arteries to become narrow or stiff. Untreated or uncontrolled hypertension can lead to a heart attack, heart failure, a stroke, kidney disease, and other problems. A blood pressure reading consists of a higher number over a lower number. Ideally, your blood pressure should be below 120/80. The first ("top") number is called the systolic pressure. It is a measure of the pressure in your arteries as your heart beats. The second ("bottom") number is called the diastolic pressure. It is a measure of the pressure in your arteries as the heart relaxes. What are the causes? The exact cause of this condition is not known. There are some conditions that result in high blood pressure. What increases the risk? Certain factors may make you more likely to develop high blood pressure. Some of these risk factors are under your control, including: Smoking. Not getting enough exercise or physical activity. Being overweight. Having too much fat, sugar, calories, or salt (sodium) in your diet. Drinking too much alcohol. Other risk factors include: Having a personal history of heart disease, diabetes, high cholesterol, or kidney disease. Stress. Having a family history of high blood pressure and high cholesterol. Having obstructive sleep apnea. Age. The risk increases with age. What are the signs or symptoms? High blood pressure may not cause symptoms. Very high blood pressure (hypertensive crisis) may cause: Headache. Fast or irregular heartbeats (palpitations). Shortness of breath. Nosebleed. Nausea and vomiting. Vision changes. Severe chest pain, dizziness, and seizures. How is this diagnosed? This condition is diagnosed by  measuring your blood pressure while you are seated, with your arm resting on a flat surface, your legs uncrossed, and your feet flat on the floor. The cuff of the blood pressure monitor will be placed directly against the skin of your upper arm at the level of your heart. Blood pressure should be measured at least twice using the same arm. Certain conditions can cause a difference in blood pressure between your right and left arms. If you have a high blood pressure reading during one visit or you have normal blood pressure with other risk factors, you may be asked to: Return on a different day to have your blood pressure checked again. Monitor your blood pressure at home for 1 week or longer. If you are diagnosed with hypertension, you may have other blood or imaging tests to help your health care provider understand your overall risk for other conditions. How is this treated? This condition is treated by making healthy lifestyle changes, such as eating healthy foods, exercising more, and reducing your alcohol intake. You may be referred for counseling on a healthy diet and physical activity. Your health care provider may prescribe medicine if lifestyle changes are not enough to get your blood pressure under control and if: Your systolic blood pressure is above 130. Your diastolic blood pressure is above 80. Your personal target blood pressure may vary depending on your medical conditions, your age, and other factors. Follow these instructions at home: Eating and drinking  Eat a diet that is high in fiber and potassium, and low in sodium, added sugar, and fat. An example of this eating plan is called the DASH diet. DASH stands for Dietary Approaches to Stop Hypertension. To eat this way: Eat   plenty of fresh fruits and vegetables. Try to fill one half of your plate at each meal with fruits and vegetables. Eat whole grains, such as whole-wheat pasta, brown rice, or whole-grain bread. Fill about one  fourth of your plate with whole grains. Eat or drink low-fat dairy products, such as skim milk or low-fat yogurt. Avoid fatty cuts of meat, processed or cured meats, and poultry with skin. Fill about one fourth of your plate with lean proteins, such as fish, chicken without skin, beans, eggs, or tofu. Avoid pre-made and processed foods. These tend to be higher in sodium, added sugar, and fat. Reduce your daily sodium intake. Many people with hypertension should eat less than 1,500 mg of sodium a day. Do not drink alcohol if: Your health care provider tells you not to drink. You are pregnant, may be pregnant, or are planning to become pregnant. If you drink alcohol: Limit how much you have to: 0-1 drink a day for women. 0-2 drinks a day for men. Know how much alcohol is in your drink. In the [place] one drink equals one 12 oz bottle of beer (355 mL), one 5 oz glass of wine (148 mL), or one 1 oz glass of hard liquor (44 mL). Lifestyle  Work with your health care provider to maintain a healthy body weight or to lose weight. Ask what an ideal weight is for you. Get at least 30 minutes of exercise that causes your heart to beat faster (aerobic exercise) most days of the week. Activities may include walking, swimming, or biking. Include exercise to strengthen your muscles (resistance exercise), such as Pilates or lifting weights, as part of your weekly exercise routine. Try to do these types of exercises for 30 minutes at least 3 days a week. Do not use any products that contain nicotine or tobacco. These products include cigarettes, chewing tobacco, and vaping devices, such as e-cigarettes. If you need help quitting, ask your health care provider. Monitor your blood pressure at home as told by your health care provider. Keep all follow-up visits. This is important. Medicines Take over-the-counter and prescription medicines only as told by your health care provider. Follow directions carefully. Blood  pressure medicines must be taken as prescribed. Do not skip doses of blood pressure medicine. Doing this puts you at risk for problems and can make the medicine less effective. Ask your health care provider about side effects or reactions to medicines that you should watch for. Contact a health care provider if you: Think you are having a reaction to a medicine you are taking. Have headaches that keep coming back (recurring). Feel dizzy. Have swelling in your ankles. Have trouble with your vision. Get help right away if you: Develop a severe headache or confusion. Have unusual weakness or numbness. Feel faint. Have severe pain in your chest or abdomen. Vomit repeatedly. Have trouble breathing. These symptoms may be an emergency. Get help right away. Call 911. Do not wait to see if the symptoms will go away. Do not drive yourself to the hospital. Summary Hypertension is when the force of blood pumping through your arteries is too strong. If this condition is not controlled, it may put you at risk for serious complications. Your personal target blood pressure may vary depending on your medical conditions, your age, and other factors. For most people, a normal blood pressure is less than 120/80. Hypertension is treated with lifestyle changes, medicines, or a combination of both. Lifestyle changes include losing weight, eating a healthy,   low-sodium diet, exercising more, and limiting alcohol. This information is not intended to replace advice given to you by your health care provider. Make sure you discuss any questions you have with your health care provider. Document Revised: 06/19/2021 Document Reviewed: 06/19/2021 Elsevier Patient Education  2023 Elsevier Inc.  

## 2021-12-12 NOTE — Progress Notes (Signed)
? ?Subjective:  ?Patient ID: George Austin, male    DOB: 1956/01/25  Age: 66 y.o. MRN: [id number] ? ?CC: Hypertension ? ? ?HPI ?Enoch presents for f/up - He complains of chronic fatigue and mild constipation.  The constipation is relieved with a suppository.  He is active and denies chest pain, shortness of breath, diaphoresis, dizziness, or lightheadedness. ? ?Outpatient Medications Prior to Visit  ?Medication Sig Dispense Refill  ? amLODipine (NORVASC) 5 MG tablet TAKE 1 TABLET (5 MG TOTAL) BY MOUTH DAILY. 30 tablet 6  ? Cholecalciferol 25 MCG (1000 UT) tablet Take by mouth.    ? Magnesium Oxide 250 MG TABS Take by mouth.    ? mometasone (NASONEX) 50 MCG/ACT nasal spray Place 2 sprays into the nose daily. 17 g 12  ? sildenafil (VIAGRA) 25 MG tablet Take 1 tablet (25 mg total) by mouth daily as needed for erectile dysfunction. 10 tablet 6  ? Vitamin D, Ergocalciferol, (DRISDOL) 1.25 MG (50000 UT) CAPS capsule Take 1 capsule (50,000 Units total) by mouth every 7 (seven) days. 5 capsule 6  ? atorvastatin (LIPITOR) 10 MG tablet Take 1 tablet (10 mg total) by mouth daily. (Patient not taking: Reported on 12/12/2021) 30 tablet 6  ? ?No facility-administered medications prior to visit.  ? ? ?ROS ?Review of Systems  ?Constitutional:  Positive for fatigue. Negative for diaphoresis and unexpected weight change.  ?HENT: Negative.    ?Eyes: Negative.  Negative for visual disturbance.  ?Respiratory:  Negative for cough, chest tightness, shortness of breath and wheezing.   ?Cardiovascular:  Negative for chest pain, palpitations and leg swelling.  ?Gastrointestinal:  Positive for constipation. Negative for abdominal pain, blood in stool, diarrhea, nausea and vomiting.  ?Endocrine: Negative.   ?Genitourinary: Negative.  Negative for difficulty urinating.  ?Musculoskeletal: Negative.  Negative for arthralgias and myalgias.  ?Skin: Negative.  Negative for color change.  ?Neurological: Negative.  Negative for dizziness,  weakness, light-headedness and headaches.  ?Hematological:  Negative for adenopathy. Does not bruise/bleed easily.  ?Psychiatric/Behavioral: Negative.    ? ?Objective:  ?BP 136/86 (BP Location: Right Arm, Patient Position: Sitting, Cuff Size: Normal)   Pulse 80   Temp 97.8 ?F (36.6 ?C) (Oral)   Resp 16   Ht 5\' 7"  (1.702 m)   Wt 111 lb 3.2 oz (50.4 kg)   SpO2 96%   BMI 17.42 kg/m?  ? ?BP Readings from Last 3 Encounters:  ?12/12/21 136/86  ?12/07/21 128/75  ?11/06/21 (!) 157/79  ? ? ?Wt Readings from Last 3 Encounters:  ?12/12/21 111 lb 3.2 oz (50.4 kg)  ?12/07/21 110 lb (49.9 kg)  ?11/06/21 111 lb (50.3 kg)  ? ? ?Physical Exam ?Vitals reviewed.  ?HENT:  ?   Nose: Nose normal.  ?   Mouth/Throat:  ?   Mouth: Mucous membranes are moist.  ?Eyes:  ?   General: No scleral icterus. ?   Conjunctiva/sclera: Conjunctivae normal.  ?Cardiovascular:  ?   Rate and Rhythm: Normal rate and regular rhythm.  ?   Heart sounds: No murmur heard. ?Pulmonary:  ?   Effort: Pulmonary effort is normal.  ?   Breath sounds: No stridor. No wheezing, rhonchi or rales.  ?Abdominal:  ?   General: Abdomen is flat.  ?   Palpations: There is no mass.  ?   Tenderness: There is no abdominal tenderness. There is no guarding or rebound.  ?   Hernia: No hernia is present.  ?Musculoskeletal:     ?  General: Normal range of motion.  ?   Cervical back: Neck supple.  ?   Right lower leg: No edema.  ?   Left lower leg: No edema.  ?Lymphadenopathy:  ?   Cervical: No cervical adenopathy.  ?Skin: ?   General: Skin is warm and dry.  ?   Coloration: Skin is not jaundiced.  ?Neurological:  ?   General: No focal deficit present.  ?   Mental Status: He is alert. Mental status is at baseline.  ?   Motor: Atrophy present. No weakness.  ?Psychiatric:     ?   Mood and Affect: Mood normal.     ?   Behavior: Behavior normal.  ? ? ?Lab Results  ?Component Value Date  ? WBC 7.2 05/03/2021  ? HGB 14.7 05/03/2021  ? HCT 43.8 05/03/2021  ? PLT 230 05/03/2021  ? GLUCOSE 86  12/12/2021  ? CHOL 223 (H) 09/11/2021  ? TRIG 98.0 09/11/2021  ? HDL 81.60 09/11/2021  ? LDLCALC 122 (H) 09/11/2021  ? ALT 19 12/12/2021  ? AST 37 12/12/2021  ? NA 134 (L) 12/12/2021  ? K 4.0 12/12/2021  ? CL 96 12/12/2021  ? CREATININE 1.28 12/12/2021  ? BUN 10 12/12/2021  ? CO2 27 12/12/2021  ? TSH 0.760 07/02/2021  ? PSA 9.20 (H) 09/11/2021  ? INR 0.9 09/11/2021  ? HGBA1C 5.0 07/28/2019  ? ? ?MR BRAIN WO CONTRAST ? ?Result Date: 08/14/2021 ?CLINICAL DATA:  Provided history: Syncope, unspecified syncope type. Convulsive syncope. Additional history provided by scanning technologist: Patient reports syncopal episode October 2022 (hitting head at that time). EXAM: MRI HEAD WITHOUT CONTRAST TECHNIQUE: Multiplanar, multiecho pulse sequences of the brain and surrounding structures were obtained without intravenous contrast. COMPARISON:  No pertinent prior exams available for comparison. FINDINGS: Brain: Cerebral volume appears normal for age. Chronic small-vessel infarcts within the bilateral cerebral hemispheric white matter. Background moderate multifocal T2 FLAIR hyperintense signal abnormality within the cerebral white matter, nonspecific but compatible with chronic small vessel ischemic disease. Minimal chronic small-vessel ischemic changes also present within the pons. Chronic lacunar infarcts within the right lentiform nucleus and right thalamus. Possible punctate chronic microhemorrhage within the left parietal lobe (series 10, image 27). There is no acute infarct. No evidence of an intracranial mass. No extra-axial fluid collection. No midline shift. Vascular: Maintained flow voids within the proximal large arterial vessels. Skull and upper cervical spine: No focal suspicious marrow lesion. Sinuses/Orbits: Visualized orbits show no acute finding. Mild mucosal thickening within the right frontal, bilateral ethmoid and right maxillary sinuses. Tiny mucous retention cyst within the right sphenoid sinus.  IMPRESSION: No evidence of acute intracranial abnormality. Chronic small-vessel infarcts within the bilateral cerebral hemispheric white matter, right basal ganglia and right thalamus. Background chronic small vessel ischemic changes which are moderate in the cerebral white matter, and minimal in the pons. Paranasal sinus disease, as described. Electronically Signed   By: Kellie Simmering D.O.   On: 08/14/2021 08:54  ? ? ?Assessment & Plan:  ? ?Smokey was seen today for hypertension. ? ?Diagnoses and all orders for this visit: ? ?Stage 3b chronic kidney disease ([place])- His blood pressure is adequately well controlled. ?-     Basic metabolic panel; Future ?-     Urinalysis, Routine w reflex microscopic; Future ?-     Urinalysis, Routine w reflex microscopic ?-     Basic metabolic panel ? ?Primary hypertension- His blood pressure is adequately well controlled. ?-     Basic  metabolic panel; Future ?-     Urinalysis, Routine w reflex microscopic; Future ?-     Urinalysis, Routine w reflex microscopic ?-     Basic metabolic panel ? ?Elevated LFTs- I think this is alcoholic hepatitis.  I have asked him to decrease his intake of alcohol. ?-     Hepatic function panel; Future ?-     Hepatic function panel ? ?Tobacco use disorder ?-     Ambulatory Referral for Lung Cancer Scre ? ?Other orders ?-     Varicella-zoster vaccine IM (Shingrix) ?-     Heplisav-B (HepB-CPG) Vaccine ? ? ?I am having Camren Gerow. Strausser maintain his sildenafil, Vitamin D (Ergocalciferol), amLODipine, Cholecalciferol, Magnesium Oxide, atorvastatin, and mometasone. ? ?No orders of the defined types were placed in this encounter. ? ? ? ?Follow-up: Return in about 6 months (around 06/13/2022). ? ?Scarlette Calico, MD ?

## 2021-12-26 ENCOUNTER — Other Ambulatory Visit: Payer: Self-pay

## 2021-12-28 ENCOUNTER — Other Ambulatory Visit: Payer: Self-pay

## 2021-12-31 ENCOUNTER — Other Ambulatory Visit: Payer: Self-pay | Admitting: Internal Medicine

## 2021-12-31 DIAGNOSIS — N1831 Chronic kidney disease, stage 3a: Secondary | ICD-10-CM

## 2022-01-04 ENCOUNTER — Ambulatory Visit (HOSPITAL_COMMUNITY)
Admission: RE | Admit: 2022-01-04 | Discharge: 2022-01-04 | Disposition: A | Discharge status: Home / Self Care | Payer: 59 | Source: Ambulatory Visit | Attending: Urology | Admitting: Urology

## 2022-01-04 DIAGNOSIS — R972 Elevated prostate specific antigen [PSA]: Secondary | ICD-10-CM | POA: Insufficient documentation

## 2022-01-04 MED ORDER — GADOBUTROL 1 MMOL/ML IV SOLN
5.0000 mL | Freq: Once | INTRAVENOUS | Status: AC | PRN
  Administered 2022-01-04: 5 mL via INTRAVENOUS

## 2022-01-09 ENCOUNTER — Telehealth: Payer: Self-pay | Admitting: Urology

## 2022-01-09 NOTE — Telephone Encounter (Signed)
Error

## 2022-01-09 NOTE — Telephone Encounter (Signed)
Prostate MRI did not show evidence of high-grade prostate cancer.  Approximately 15-20% of MRIs can be negative with cancer still present.  Based on his PSA level and prostate size recommend scheduling standard office biopsy.  Please let me know if he has any questions ?

## 2022-01-09 NOTE — Telephone Encounter (Signed)
Notified patient as instructed, Patient states he will think about biopsy  ? ?

## 2022-01-11 ENCOUNTER — Telehealth: Payer: Self-pay

## 2022-01-11 NOTE — Telephone Encounter (Signed)
Pt calls and states he has questions about why he needs a prostate biopsy and what the biopsy would entail. Lengthy conversation had with patient explaining to him the step by step break down of a prostate biopsy in office. Pt denies the use of blood thinners or ASA products. Pt scheduled for BX and results appointment. BX instructions sent via mychart.

## 2022-01-16 ENCOUNTER — Ambulatory Visit (INDEPENDENT_AMBULATORY_CARE_PROVIDER_SITE_OTHER): Payer: 59

## 2022-01-16 DIAGNOSIS — N1831 Chronic kidney disease, stage 3a: Secondary | ICD-10-CM | POA: Diagnosis not present

## 2022-01-22 ENCOUNTER — Other Ambulatory Visit: Payer: Self-pay | Admitting: Internal Medicine

## 2022-01-22 DIAGNOSIS — N2889 Other specified disorders of kidney and ureter: Secondary | ICD-10-CM

## 2022-01-24 ENCOUNTER — Encounter: Payer: Self-pay | Admitting: Urology

## 2022-01-24 ENCOUNTER — Ambulatory Visit: Payer: 59 | Admitting: Urology

## 2022-01-24 VITALS — BP 130/76 | HR 107 | Ht 67.0 in | Wt 112.0 lb

## 2022-01-24 DIAGNOSIS — R972 Elevated prostate specific antigen [PSA]: Secondary | ICD-10-CM | POA: Diagnosis not present

## 2022-01-24 MED ORDER — GENTAMICIN SULFATE 40 MG/ML IJ SOLN
80.0000 mg | Freq: Once | INTRAMUSCULAR | Status: AC
  Administered 2022-01-24: 80 mg via INTRAMUSCULAR

## 2022-01-24 MED ORDER — LEVOFLOXACIN 500 MG PO TABS
500.0000 mg | ORAL_TABLET | Freq: Once | ORAL | Status: AC
  Administered 2022-01-24: 500 mg via ORAL

## 2022-01-24 NOTE — Progress Notes (Signed)
Prostate Biopsy Procedure   Informed consent was obtained after discussing risks/benefits of the procedure.  A time out was performed to ensure correct patient identity.  Pre-Procedure: - Last PSA Level: 8.4 on 12/12/2021 Lab Results  Component Value Date   PSA 9.20 (H) 09/11/2021   PSA 2.3 07/08/2017   PSA 2.16 01/18/2015   - Gentamicin given prophylactically - Levaquin 500 mg administered PO -Transrectal Ultrasound performed revealing a 25 gm prostate -No significant hypoechoic or median lobe noted  Procedure: - Prostate block performed using 10 cc 1% lidocaine and biopsies taken from sextant areas, a total of 12 under ultrasound guidance.  Post-Procedure: - Patient tolerated the procedure well - He was counseled to seek immediate medical attention if experiences any severe pain, significant bleeding, or fevers - Return in one week to discuss biopsy results   Irineo Axon, MD

## 2022-01-28 LAB — SURGICAL PATHOLOGY

## 2022-01-31 ENCOUNTER — Ambulatory Visit (INDEPENDENT_AMBULATORY_CARE_PROVIDER_SITE_OTHER): Payer: 59 | Admitting: Urology

## 2022-01-31 ENCOUNTER — Encounter: Payer: Self-pay | Admitting: Urology

## 2022-01-31 VITALS — BP 128/78 | HR 74 | Ht 67.0 in | Wt 112.0 lb

## 2022-01-31 DIAGNOSIS — R972 Elevated prostate specific antigen [PSA]: Secondary | ICD-10-CM | POA: Diagnosis not present

## 2022-01-31 NOTE — Progress Notes (Signed)
   01/31/2022 8:20 AM   George Austin May 11, 1956 [id number]  Referring provider: Etta Grandchild, MD [address] [place],  [place] [address]  Chief Complaint  Patient presents with   Other    Urologic history: 1.Elevated PSA Biopsy 01/24/2022 PSA 9.2/8.4 on repeat Volume 27 cc; Path benign prostate tissue  HPI: 66 y.o. male presents for prostate biopsy follow-up.  No postbiopsy complaints Path all cores showed benign prostate tissue   PMH: Past Medical History:  Diagnosis Date   Allergy    Elevated serum creatinine 07/2019   Hyperlipidemia 07/2019   Hypertension    Vitamin D deficiency 07/2019    Surgical History: Past Surgical History:  Procedure Laterality Date   COLONOSCOPY     over 10 yrs ago in [place], normal per patient 08/30/19   CYST EXCISION Left    thigh   MULTIPLE TOOTH EXTRACTIONS     WISDOM TOOTH EXTRACTION      Home Medications:  Allergies as of 01/31/2022   No Known Allergies      Medication List        Accurate as of January 31, 2022  8:20 AM. If you have any questions, ask your nurse or doctor.          amLODipine 5 MG tablet Commonly known as: NORVASC TAKE 1 TABLET (5 MG TOTAL) BY MOUTH DAILY.   Cholecalciferol 25 MCG (1000 UT) tablet Take by mouth.   Magnesium Oxide 250 MG Tabs Take by mouth.   mometasone 50 MCG/ACT nasal spray Commonly known as: Nasonex Place 2 sprays into the nose daily.   sildenafil 25 MG tablet Commonly known as: VIAGRA Take 1 tablet (25 mg total) by mouth daily as needed for erectile dysfunction.   Vitamin D (Ergocalciferol) 1.25 MG (50000 UNIT) Caps capsule Commonly known as: DRISDOL Take 1 capsule (50,000 Units total) by mouth every 7 (seven) days.        Allergies: No Known Allergies  Family History: Family History  Problem Relation Age of Onset   Diabetes Mother    Stomach cancer Mother    Hypertension Father    Stroke Sister    Lupus Sister    Stroke Brother    Diabetes  Brother    Colon cancer Neg Hx    Rectal cancer Neg Hx    Esophageal cancer Neg Hx     Social History:  reports that he has been smoking cigarettes. He has a 10.00 pack-year smoking history. He has never used smokeless tobacco. He reports current alcohol use of about 21.0 standard drinks of alcohol per week. He reports that he does not use drugs.   Physical Exam: BP 128/78   Pulse 74   Ht 5\' 7"  (1.702 m)   Wt 112 lb (50.8 kg)   BMI 17.54 kg/m   Constitutional:  Alert and oriented, No acute distress.  Assessment & Plan:    1.  Elevated PSA Benign prostate biopsy Recommend continue PSA monitoring and a follow-up visit was scheduled in 6 months with a PSA   , MD  [facility] [address] [place], [place] [phone]

## 2022-02-01 ENCOUNTER — Other Ambulatory Visit: Payer: Self-pay

## 2022-02-05 ENCOUNTER — Other Ambulatory Visit: Payer: Self-pay

## 2022-02-08 ENCOUNTER — Other Ambulatory Visit: Payer: Self-pay | Admitting: Internal Medicine

## 2022-02-08 DIAGNOSIS — N2889 Other specified disorders of kidney and ureter: Secondary | ICD-10-CM

## 2022-02-12 ENCOUNTER — Ambulatory Visit: Payer: 59

## 2022-02-12 DIAGNOSIS — N2889 Other specified disorders of kidney and ureter: Secondary | ICD-10-CM

## 2022-02-12 MED ORDER — GADOBUTROL 1 MMOL/ML IV SOLN
5.0000 mL | Freq: Once | INTRAVENOUS | Status: AC | PRN
  Administered 2022-02-12: 5 mL via INTRAVENOUS

## 2022-03-08 ENCOUNTER — Telehealth: Payer: Self-pay

## 2022-03-08 ENCOUNTER — Other Ambulatory Visit: Payer: Self-pay | Admitting: Internal Medicine

## 2022-03-08 ENCOUNTER — Other Ambulatory Visit: Payer: Self-pay

## 2022-03-08 DIAGNOSIS — I10 Essential (primary) hypertension: Secondary | ICD-10-CM

## 2022-03-08 MED ORDER — AMLODIPINE BESYLATE 5 MG PO TABS
5.0000 mg | ORAL_TABLET | Freq: Every day | ORAL | 0 refills | Status: DC
  Filled 2022-03-08: qty 90, 90d supply, fill #0
  Filled 2022-03-08: qty 30, 30d supply, fill #0

## 2022-03-08 NOTE — Telephone Encounter (Signed)
Pt is requesting a refill on: amLODipine (NORVASC) 5 MG tablet  Pharmacy: [facility] at [facility]  LOV 12/12/21 ROV 06/13/22

## 2022-04-19 ENCOUNTER — Telehealth: Payer: Self-pay

## 2022-04-19 DIAGNOSIS — I10 Essential (primary) hypertension: Secondary | ICD-10-CM

## 2022-04-19 NOTE — Telephone Encounter (Signed)
Pt has called in asking if the paperwork for his Amlodipine to be verified for his mail order pharmacy with his change of health insurance been completed and faxed back? Pt states the paperwork would have came from [place].

## 2022-04-22 MED ORDER — AMLODIPINE BESYLATE 5 MG PO TABS: 5.0000 mg | ORAL_TABLET | Freq: Every day | ORAL | 0 refills | Status: DC

## 2022-04-22 NOTE — Addendum Note (Signed)
Addended by: Darryll Capers on: 04/22/2022 11:41 AM   Modules accepted: Orders

## 2022-04-22 NOTE — Telephone Encounter (Signed)
Yes, [facility]  Pt informed.

## 2022-04-30 ENCOUNTER — Other Ambulatory Visit: Payer: Self-pay

## 2022-04-30 ENCOUNTER — Ambulatory Visit (INDEPENDENT_AMBULATORY_CARE_PROVIDER_SITE_OTHER): Payer: Commercial Managed Care - HMO | Admitting: Emergency Medicine

## 2022-04-30 ENCOUNTER — Encounter: Payer: Self-pay | Admitting: Emergency Medicine

## 2022-04-30 VITALS — BP 128/76 | HR 108 | Temp 97.9°F | Ht 67.0 in | Wt 110.4 lb

## 2022-04-30 DIAGNOSIS — L219 Seborrheic dermatitis, unspecified: Secondary | ICD-10-CM

## 2022-04-30 MED ORDER — KETOCONAZOLE 2 % EX SHAM
1.0000 | MEDICATED_SHAMPOO | CUTANEOUS | 0 refills | Status: DC
  Filled 2022-04-30: qty 120, 30d supply, fill #0

## 2022-04-30 NOTE — Patient Instructions (Signed)
Seborrheic Dermatitis, Adult Seborrheic dermatitis is a skin disease that causes red, scaly patches. It usually occurs on the scalp, and it is often called dandruff. The patches may appear on other parts of the body. Skin patches tend to appear where there are many oil glands in the skin. Areas of the body that are commonly affected include the: Scalp. Ears. Eyebrows. Face. Bearded area of men's faces. Skin folds of the body, such as the armpits, groin, and buttocks. Chest. The condition may come and go for no known reason, and it is often long-lasting (chronic). What are the causes? The cause of this condition is not known. What increases the risk? The following factors may make you more likely to develop this condition: Having certain conditions, such as: HIV (human immunodeficiency virus). AIDS (acquired immunodeficiency syndrome). Parkinson's disease. Mood disorders, such as depression. Being 40-60 years old. What are the signs or symptoms? Symptoms of this condition include: Thick scales on the scalp. Redness on the face or in the armpits. Skin that is flaky. The flakes may be white or yellow. Skin that seems oily or dry but is not helped with moisturizers. Itching or burning in the affected areas. How is this diagnosed? This condition is diagnosed with a medical history and physical exam. A sample of your skin may be tested (skin biopsy). You may need to see a skin specialist (dermatologist). How is this treated? There is no cure for this condition, but treatment can help to manage the symptoms. You may get treatment to remove scales, lower the risk of skin infection, and reduce swelling or itching. Treatment may include: Creams that reduce skin yeast. Medicated shampoo. Moisturizing creams or ointments. Creams that reduce swelling and irritation (steroids). Follow these instructions at home: Apply over-the-counter and prescription medicines only as told by your health care  provider. Use any medicated shampoo, skin creams, or ointments only as told by your health care provider. Keep all follow-up visits as told by your health care provider. This is important. Contact a health care provider if: Your symptoms do not improve with treatment. Your symptoms get worse. You have new symptoms. Get help right away if: Your condition rapidly worsens with treatment. Summary Seborrheic dermatitis is a skin disease that causes red, scaly patches. Seborrheic dermatitis commonly affects the scalp, face, and skin folds. There is no cure for this condition, but treatment can help to manage the symptoms. This information is not intended to replace advice given to you by your health care provider. Make sure you discuss any questions you have with your health care provider. Document Revised: 10/24/2021 Document Reviewed: 05/20/2019 Elsevier Patient Education  2023 Elsevier Inc.  

## 2022-04-30 NOTE — Assessment & Plan Note (Signed)
Differential diagnosis discussed. May benefit from Nizoral shampoo twice a week May need follow-up with dermatology if no better

## 2022-04-30 NOTE — Progress Notes (Signed)
George Austin 66 y.o.   Chief Complaint  Patient presents with   bumps on scalp    Noticed a couple days ago     HISTORY OF PRESENT ILLNESS: Acute problem visit today, patient of Dr. Scarlette Calico This is a 66 y.o. male complaining of itchy rash/bumps on scalp noticed a couple days ago.  States he has similar episode about 25 years ago.  It went away with a medicated shampoo he was prescribed.  Has no other complaints or medical concerns today.  HPI   Prior to Admission medications   Medication Sig Start Date End Date Taking? Authorizing Provider  amLODipine (NORVASC) 5 MG tablet Take 1 tablet (5 mg total) by mouth daily. 04/22/22 04/22/23 Yes Janith Lima, MD  Cholecalciferol 25 MCG (1000 UT) tablet Take by mouth.   Yes [provider]  ketoconazole (NIZORAL) 2 % shampoo Apply 1 Application topically 2 (two) times a week. 05/02/22  Yes SagardiaInes Bloomer, MD  Magnesium Oxide 250 MG TABS Take by mouth.   Yes [provider]  mometasone (NASONEX) 50 MCG/ACT nasal spray Place 2 sprays into the nose daily. 07/02/21  Yes Vevelyn Francois, NP  sildenafil (VIAGRA) 25 MG tablet Take 1 tablet (25 mg total) by mouth daily as needed for erectile dysfunction. 01/20/19  Yes Azzie Glatter, FNP  Vitamin D, Ergocalciferol, (DRISDOL) 1.25 MG (50000 UT) CAPS capsule Take 1 capsule (50,000 Units total) by mouth every 7 (seven) days. 08/10/19  Yes Azzie Glatter, FNP    No Known Allergies  Patient Active Problem List   Diagnosis Date Noted   Hyperlipidemia LDL goal <130 09/16/2021   Elevated LFTs 09/11/2021   Encounter for general adult medical examination with abnormal findings 09/11/2021   Stage 3b chronic kidney disease ([place]) 09/11/2021   Need for vaccination 09/11/2021   PSA elevation 09/11/2021   Seasonal allergies 07/28/2019   Vitamin D deficiency 01/21/2019   HTN (hypertension) 05/03/2013   Tobacco use disorder 05/03/2013    Past Medical History:  Diagnosis  Date   Allergy    Elevated serum creatinine 07/2019   Hyperlipidemia 07/2019   Hypertension    Vitamin D deficiency 07/2019    Past Surgical History:  Procedure Laterality Date   COLONOSCOPY     over 10 yrs ago in [place], normal per patient 08/30/19   CYST EXCISION Left    thigh   MULTIPLE TOOTH EXTRACTIONS     WISDOM TOOTH EXTRACTION      Social History   Socioeconomic History   Marital status: Single    Spouse name: Not on file   Number of children: Not on file   Years of education: Not on file   Highest education level: Not on file  Occupational History   Not on file  Tobacco Use   Smoking status: Every Day    Packs/day: 0.25    Years: 40.00    Total pack years: 10.00    Types: Cigarettes   Smokeless tobacco: Never   Tobacco comments:    smoking .25 - 0.33 ppd  Vaping Use   Vaping Use: Never used  Substance and Sexual Activity   Alcohol use: Yes    Alcohol/week: 21.0 standard drinks of alcohol    Types: 21 Shots of liquor per week    Comment: liquor   Drug use: No   Sexual activity: Yes    Partners: Female  Other Topics Concern   Not on file  Social History  Narrative   Left handed    Social Determinants of Health   Financial Resource Strain: Not on file  Food Insecurity: Not on file  Transportation Needs: Not on file  Physical Activity: Not on file  Stress: Not on file  Social Connections: Not on file  Intimate Partner Violence: Not on file    Family History  Problem Relation Age of Onset   Diabetes Mother    Stomach cancer Mother    Hypertension Father    Stroke Sister    Lupus Sister    Stroke Brother    Diabetes Brother    Colon cancer Neg Hx    Rectal cancer Neg Hx    Esophageal cancer Neg Hx      Review of Systems  Constitutional: Negative.  Negative for chills and fever.  HENT:  Negative for congestion and sore throat.   Respiratory: Negative.  Negative for cough and shortness of breath.   Cardiovascular: Negative.  Negative  for chest pain and palpitations.  Gastrointestinal:  Negative for abdominal pain, diarrhea, nausea and vomiting.  Genitourinary: Negative.   Skin:  Positive for rash (Scalp).  Neurological:  Negative for dizziness and headaches.  All other systems reviewed and are negative.  Today's Vitals   04/30/22 0944  BP: 128/76  Pulse: (!) 108  Temp: 97.9 F (36.6 C)  TempSrc: Oral  SpO2: 99%  Weight: 110 lb 6 oz (50.1 kg)  Height: 5\' 7"  (1.702 m)   Body mass index is 17.29 kg/m.   Physical Exam Vitals reviewed.  Constitutional:      Appearance: Normal appearance.  HENT:     Head: Normocephalic.     Mouth/Throat:     Mouth: Mucous membranes are moist.     Pharynx: Oropharynx is clear.  Eyes:     Extraocular Movements: Extraocular movements intact.     Conjunctiva/sclera: Conjunctivae normal.     Pupils: Pupils are equal, round, and reactive to light.  Cardiovascular:     Rate and Rhythm: Normal rate.  Pulmonary:     Effort: Pulmonary effort is normal.  Musculoskeletal:     Cervical back: No tenderness.  Lymphadenopathy:     Cervical: No cervical adenopathy.  Skin:    General: Skin is warm and dry.     Capillary Refill: Capillary refill takes less than 2 seconds.     Comments: Scalp: Scaly erythematous rash  Neurological:     General: No focal deficit present.     Mental Status: He is alert and oriented to person, place, and time.  Psychiatric:        Mood and Affect: Mood normal.        Behavior: Behavior normal.      ASSESSMENT & PLAN: A total of 31 minutes was spent with the patient and counseling/coordination of care regarding preparing for this visit, review of most recent office visit notes, review of all medications, review of chronic medical problems, differential diagnosis of scalp rash, treatment, prognosis, documentation, and need for follow-up.  Problem List Items Addressed This Visit       Musculoskeletal and Integument   Seborrheic dermatitis -  Primary    Differential diagnosis discussed. May benefit from Nizoral shampoo twice a week May need follow-up with dermatology if no better      Relevant Medications   ketoconazole (NIZORAL) 2 % shampoo (Start on 05/02/2022)   Patient Instructions  Seborrheic Dermatitis, Adult Seborrheic dermatitis is a skin disease that causes red, scaly patches. It usually  occurs on the scalp, and it is often called dandruff. The patches may appear on other parts of the body. Skin patches tend to appear where there are many oil glands in the skin. Areas of the body that are commonly affected include the: Scalp. Ears. Eyebrows. Face. Bearded area of [employer]. Skin folds of the body, such as the armpits, groin, and buttocks. Chest. The condition may come and go for no known reason, and it is often long-lasting (chronic). What are the causes? The cause of this condition is not known. What increases the risk? The following factors may make you more likely to develop this condition: Having certain conditions, such as: HIV (human immunodeficiency virus). AIDS (acquired immunodeficiency syndrome). Parkinson's disease. Mood disorders, such as depression. Being 24-48 years old. What are the signs or symptoms? Symptoms of this condition include: Thick scales on the scalp. Redness on the face or in the armpits. Skin that is flaky. The flakes may be white or yellow. Skin that seems oily or dry but is not helped with moisturizers. Itching or burning in the affected areas. How is this diagnosed? This condition is diagnosed with a medical history and physical exam. A sample of your skin may be tested (skin biopsy). You may need to see a skin specialist (dermatologist). How is this treated? There is no cure for this condition, but treatment can help to manage the symptoms. You may get treatment to remove scales, lower the risk of skin infection, and reduce swelling or itching. Treatment may  include: Creams that reduce skin yeast. Medicated shampoo. Moisturizing creams or ointments. Creams that reduce swelling and irritation (steroids). Follow these instructions at home: Apply over-the-counter and prescription medicines only as told by your health care provider. Use any medicated shampoo, skin creams, or ointments only as told by your health care provider. Keep all follow-up visits as told by your health care provider. This is important. Contact a health care provider if: Your symptoms do not improve with treatment. Your symptoms get worse. You have new symptoms. Get help right away if: Your condition rapidly worsens with treatment. Summary Seborrheic dermatitis is a skin disease that causes red, scaly patches. Seborrheic dermatitis commonly affects the scalp, face, and skin folds. There is no cure for this condition, but treatment can help to manage the symptoms. This information is not intended to replace advice given to you by your health care provider. Make sure you discuss any questions you have with your health care provider. Document Revised: 10/24/2021 Document Reviewed: 05/20/2019 Elsevier Patient Education  2023 Elsevier Inc.    Edwina Barth, MD [facility]

## 2022-06-13 ENCOUNTER — Ambulatory Visit (INDEPENDENT_AMBULATORY_CARE_PROVIDER_SITE_OTHER): Payer: Commercial Managed Care - HMO | Admitting: Internal Medicine

## 2022-06-13 ENCOUNTER — Ambulatory Visit (INDEPENDENT_AMBULATORY_CARE_PROVIDER_SITE_OTHER): Payer: Commercial Managed Care - HMO

## 2022-06-13 ENCOUNTER — Encounter: Payer: Self-pay | Admitting: Internal Medicine

## 2022-06-13 VITALS — BP 144/86 | HR 103 | Temp 97.8°F | Ht 67.0 in | Wt 109.0 lb

## 2022-06-13 DIAGNOSIS — Z23 Encounter for immunization: Secondary | ICD-10-CM

## 2022-06-13 DIAGNOSIS — R053 Chronic cough: Secondary | ICD-10-CM | POA: Diagnosis not present

## 2022-06-13 DIAGNOSIS — R7989 Other specified abnormal findings of blood chemistry: Secondary | ICD-10-CM

## 2022-06-13 DIAGNOSIS — N1832 Chronic kidney disease, stage 3b: Secondary | ICD-10-CM

## 2022-06-13 DIAGNOSIS — I10 Essential (primary) hypertension: Secondary | ICD-10-CM | POA: Diagnosis not present

## 2022-06-13 DIAGNOSIS — F17200 Nicotine dependence, unspecified, uncomplicated: Secondary | ICD-10-CM

## 2022-06-13 DIAGNOSIS — R809 Proteinuria, unspecified: Secondary | ICD-10-CM

## 2022-06-13 DIAGNOSIS — J370 Chronic laryngitis: Secondary | ICD-10-CM | POA: Insufficient documentation

## 2022-06-13 DIAGNOSIS — R5382 Chronic fatigue, unspecified: Secondary | ICD-10-CM | POA: Diagnosis not present

## 2022-06-13 DIAGNOSIS — F1010 Alcohol abuse, uncomplicated: Secondary | ICD-10-CM

## 2022-06-13 DIAGNOSIS — E559 Vitamin D deficiency, unspecified: Secondary | ICD-10-CM | POA: Diagnosis not present

## 2022-06-13 LAB — HEPATIC FUNCTION PANEL
ALT: 22 U/L (ref 0–53)
AST: 51 U/L — ABNORMAL HIGH (ref 0–37)
Albumin: 4.6 g/dL (ref 3.5–5.2)
Alkaline Phosphatase: 85 U/L (ref 39–117)
Bilirubin, Direct: 0.1 mg/dL (ref 0.0–0.3)
Total Bilirubin: 0.6 mg/dL (ref 0.2–1.2)
Total Protein: 8.3 g/dL (ref 6.0–8.3)

## 2022-06-13 LAB — URINALYSIS, ROUTINE W REFLEX MICROSCOPIC
Ketones, ur: 15 — AB
Leukocytes,Ua: NEGATIVE
Nitrite: NEGATIVE
Specific Gravity, Urine: 1.02 (ref 1.000–1.030)
Total Protein, Urine: >=300 — AB
Urine Glucose: NEGATIVE
Urobilinogen, UA: 0.2 (ref 0.0–1.0)
pH: 5.5 (ref 5.0–8.0)

## 2022-06-13 LAB — CBC WITH DIFFERENTIAL/PLATELET
Basophils Absolute: 0 10*3/uL (ref 0.0–0.1)
Basophils Relative: 0.6 % (ref 0.0–3.0)
Eosinophils Absolute: 0.1 10*3/uL (ref 0.0–0.7)
Eosinophils Relative: 1 % (ref 0.0–5.0)
HCT: 42.1 % (ref 39.0–52.0)
Hemoglobin: 13.9 g/dL (ref 13.0–17.0)
Lymphocytes Relative: 28.8 % (ref 12.0–46.0)
Lymphs Abs: 2 10*3/uL (ref 0.7–4.0)
MCHC: 33 g/dL (ref 30.0–36.0)
MCV: 101.8 fl — ABNORMAL HIGH (ref 78.0–100.0)
Monocytes Absolute: 0.7 10*3/uL (ref 0.1–1.0)
Monocytes Relative: 10 % (ref 3.0–12.0)
Neutro Abs: 4.2 10*3/uL (ref 1.4–7.7)
Neutrophils Relative %: 59.6 % (ref 43.0–77.0)
Platelets: 252 10*3/uL (ref 150.0–400.0)
RBC: 4.13 Mil/uL — ABNORMAL LOW (ref 4.22–5.81)
RDW: 16.7 % — ABNORMAL HIGH (ref 11.5–15.5)
WBC: 7 10*3/uL (ref 4.0–10.5)

## 2022-06-13 LAB — BASIC METABOLIC PANEL
BUN: 24 mg/dL — ABNORMAL HIGH (ref 6–23)
CO2: 22 mEq/L (ref 19–32)
Calcium: 10.1 mg/dL (ref 8.4–10.5)
Chloride: 91 mEq/L — ABNORMAL LOW (ref 96–112)
Creatinine, Ser: 1.68 mg/dL — ABNORMAL HIGH (ref 0.40–1.50)
GFR: 42.77 mL/min — ABNORMAL LOW (ref >60.00)
Glucose, Bld: 86 mg/dL (ref 70–99)
Potassium: 4.2 mEq/L (ref 3.5–5.1)
Sodium: 133 mEq/L — ABNORMAL LOW (ref 135–145)

## 2022-06-13 LAB — TSH: TSH: 0.86 u[IU]/mL (ref 0.35–5.50)

## 2022-06-13 LAB — VITAMIN D 25 HYDROXY (VIT D DEFICIENCY, FRACTURES): VITD: 27.66 ng/mL — ABNORMAL LOW (ref 30.00–100.00)

## 2022-06-13 NOTE — Progress Notes (Signed)
Subjective:  Patient ID: George Austin, male    DOB: 10/21/1955  Age: 66 y.o. MRN: [id number]  CC: Hypertension and Cough   HPI George Austin presents for f/up -  He complains of chronic fatigue, laryngitis, and rare nonproductive cough.  He denies odynophagia, dysphagia, hemoptysis, wheezing, or shortness of breath.  Outpatient Medications Prior to Visit  Medication Sig Dispense Refill   amLODipine (NORVASC) 5 MG tablet Take 1 tablet (5 mg total) by mouth daily. 90 tablet 0   Cholecalciferol 25 MCG (1000 UT) tablet Take by mouth.     ketoconazole (NIZORAL) 2 % shampoo Apply 1 Application topically 2 (two) times a week. 120 mL 0   Magnesium Oxide 250 MG TABS Take by mouth.     mometasone (NASONEX) 50 MCG/ACT nasal spray Place 2 sprays into the nose daily. 17 g 12   Vitamin D, Ergocalciferol, (DRISDOL) 1.25 MG (50000 UT) CAPS capsule Take 1 capsule (50,000 Units total) by mouth every 7 (seven) days. 5 capsule 6   sildenafil (VIAGRA) 25 MG tablet Take 1 tablet (25 mg total) by mouth daily as needed for erectile dysfunction. 10 tablet 6   No facility-administered medications prior to visit.    ROS Review of Systems  Constitutional:  Positive for fatigue. Negative for chills, diaphoresis and unexpected weight change.  HENT:  Positive for voice change. Negative for sore throat and trouble swallowing.   Respiratory:  Positive for cough. Negative for chest tightness, shortness of breath and wheezing.   Cardiovascular:  Negative for chest pain, palpitations and leg swelling.  Gastrointestinal:  Negative for abdominal pain, constipation, diarrhea, nausea and vomiting.  Genitourinary:  Negative for difficulty urinating.  Musculoskeletal: Negative.   Skin: Negative.   Neurological:  Negative for dizziness, weakness and headaches.  Hematological:  Negative for adenopathy. Does not bruise/bleed easily.  Psychiatric/Behavioral:  Negative for agitation.     Objective:  BP (!) 144/86 (BP  Location: Left Arm, Patient Position: Sitting, Cuff Size: Normal)   Pulse (!) 103   Temp 97.8 F (36.6 C) (Oral)   Ht 5\' 7"  (1.702 m)   Wt 109 lb (49.4 kg)   SpO2 98%   BMI 17.07 kg/m   BP Readings from Last 3 Encounters:  06/13/22 (!) 144/86  04/30/22 128/76  01/31/22 128/78    Wt Readings from Last 3 Encounters:  06/13/22 109 lb (49.4 kg)  04/30/22 110 lb 6 oz (50.1 kg)  01/31/22 112 lb (50.8 kg)    Physical Exam Constitutional:      General: He is not in acute distress.    Appearance: He is cachectic. He is ill-appearing. He is not toxic-appearing or diaphoretic.  HENT:     Nose: Nose normal.     Mouth/Throat:     Mouth: Mucous membranes are moist.  Eyes:     General: No scleral icterus.    Conjunctiva/sclera: Conjunctivae normal.  Cardiovascular:     Rate and Rhythm: Regular rhythm. Tachycardia present.     Pulses: Normal pulses.     Heart sounds: No murmur heard.    No gallop.  Pulmonary:     Effort: Pulmonary effort is normal.     Breath sounds: No stridor. No wheezing, rhonchi or rales.  Abdominal:     General: Abdomen is flat.     Palpations: There is no mass.     Tenderness: There is no abdominal tenderness. There is no guarding.     Hernia: No hernia is present.  Musculoskeletal:        General: Normal range of motion.     Cervical back: Neck supple.     Right lower leg: No edema.     Left lower leg: No edema.  Skin:    General: Skin is warm and dry.  Neurological:     Mental Status: He is alert. Mental status is at baseline.     Motor: Weakness and atrophy present.     Coordination: Coordination is intact.     Lab Results  Component Value Date   WBC 7.0 06/13/2022   HGB 13.9 06/13/2022   HCT 42.1 06/13/2022   PLT 252.0 06/13/2022   GLUCOSE 86 06/13/2022   CHOL 223 (H) 09/11/2021   TRIG 98.0 09/11/2021   HDL 81.60 09/11/2021   LDLCALC 122 (H) 09/11/2021   ALT 22 06/13/2022   AST 51 (H) 06/13/2022   NA 133 (L) 06/13/2022   K 4.2  06/13/2022   CL 91 (L) 06/13/2022   CREATININE 1.68 (H) 06/13/2022   BUN 24 (H) 06/13/2022   CO2 22 06/13/2022   TSH 0.86 06/13/2022   PSA 8.4 12/07/2021   INR 0.9 09/11/2021   HGBA1C 5.0 07/28/2019    MR PROSTATE W WO CONTRAST  Result Date: 01/07/2022 CLINICAL DATA:  Persistently elevated PSA.  R97.20 EXAM: MR PROSTATE WITHOUT AND WITH CONTRAST TECHNIQUE: Multiplanar multisequence MRI images were obtained of the pelvis centered about the prostate. Pre and post contrast images were obtained. CONTRAST:  17mL GADAVIST GADOBUTROL 1 MMOL/ML IV SOLN COMPARISON:  None Available. FINDINGS: Despite efforts by the technologist and patient, motion artifact is present on today's exam and could not be eliminated. This reduces exam sensitivity and specificity. Prostate: No findings of intermediate or higher suspicion for prostate cancer identified. No significant focal lesion identified. Volume: 3D volumetric analysis: Prostate volume 30.02 cc (5.7 by 3.9 by 3.2 cm). 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 IMPRESSION: 1. No findings of intermediate or higher suspicion for prostate cancer identified by MRI. 2. 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 Electronically Signed   By: Van Clines M.D.   On: 01/07/2022 14:36   DG Chest 2 View  Result Date: 06/13/2022 CLINICAL DATA:  Chronic cough EXAM: CHEST - 2 VIEW COMPARISON:  None Available. FINDINGS: The heart size and mediastinal contours are within normal limits. Hyperinflated lungs. No focal airspace consolidation, pleural effusion, or pneumothorax. The visualized skeletal structures are unremarkable. IMPRESSION: No active cardiopulmonary disease. Electronically Signed   By: Davina Poke D.O.   On: 06/13/2022 10:56      Assessment & Plan:   George Austin was seen today for hypertension and cough.  Diagnoses and all orders for this visit:  Chronic fatigue- Labs are negative for secondary causes. -     TSH; Future -     CBC with Differential/Platelet; Future -     TSH -     CBC with Differential/Platelet  Chronic cough- CXR is negative. -     DG Chest 2 View; Future  Primary hypertension- His BP is adequately well controlled. -     Basic metabolic panel; Future -     TSH; Future -     Urinalysis, Routine w reflex microscopic; Future -     CBC with Differential/Platelet; Future -     Basic metabolic panel -  TSH -     Urinalysis, Routine w reflex microscopic -     CBC with Differential/Platelet  Stage 3b chronic kidney disease ([facility]) -     Basic metabolic panel; Future -     Urinalysis, Routine w reflex microscopic; Future -     Basic metabolic panel -     Urinalysis, Routine w reflex microscopic -     Ambulatory referral to Nephrology  Tobacco use disorder -     Ambulatory Referral for Lung Cancer Scre  Elevated LFTs -     Hepatic function panel; Future -     Hepatitis C antibody; Future -     Hepatic function panel -     Hepatitis C antibody  Laryngitis, chronic -     Ambulatory referral to ENT  Vitamin D deficiency -     VITAMIN D 25 Hydroxy (Vit-D Deficiency, Fractures); Future -     VITAMIN D 25 Hydroxy (Vit-D Deficiency, Fractures)  Proteinuria, unspecified type -     Ambulatory referral to Nephrology  Alcohol abuse -     thiamine (VITAMIN B1) 100 MG tablet; Take 1 tablet (100 mg total) by mouth daily. -     AMB Referral to [place]  Other orders -     Flu Vaccine QUAD 6+ mos PF IM (Fluarix Quad PF)   I have discontinued Harlan Stains. Soberano's sildenafil. I am also having him start on thiamine. Additionally, I am having him maintain his Vitamin D (Ergocalciferol), Cholecalciferol, Magnesium Oxide, mometasone, amLODipine, and ketoconazole.  Meds ordered  this encounter  Medications   thiamine (VITAMIN B1) 100 MG tablet    Sig: Take 1 tablet (100 mg total) by mouth daily.    Dispense:  90 tablet    Refill:  1     Follow-up: No follow-ups on file.  Scarlette Calico, MD

## 2022-06-14 LAB — HEPATITIS C ANTIBODY: Hepatitis C Ab: NONREACTIVE

## 2022-06-18 ENCOUNTER — Other Ambulatory Visit: Payer: Self-pay

## 2022-06-18 ENCOUNTER — Telehealth: Payer: Self-pay | Admitting: *Deleted

## 2022-06-18 DIAGNOSIS — F1010 Alcohol abuse, uncomplicated: Secondary | ICD-10-CM | POA: Insufficient documentation

## 2022-06-18 MED ORDER — THIAMINE HCL 100 MG PO TABS
100.0000 mg | ORAL_TABLET | Freq: Every day | ORAL | 1 refills | Status: DC
  Filled 2022-06-18 – 2023-04-18 (×2): qty 90, 90d supply, fill #0

## 2022-06-18 NOTE — Chronic Care Management (AMB) (Signed)
  Care Coordination  Outreach Note  06/18/2022 Name: George Austin MRN: [id number] DOB: 1955-10-18   Care Coordination Outreach Attempts:   Pt wants to look over last after visit summary before scheduling - says he will call back    Follow Up Plan:  Additional outreach attempts will be made to offer the patient care coordination information and services.   Encounter Outcome:  Pt. Request to Call Back  Julian Hy, [facility] Care Coordination Care Guide Direct Dial: [place], [place] Direct Dial: [phone]

## 2022-06-24 ENCOUNTER — Telehealth: Payer: Self-pay | Admitting: Internal Medicine

## 2022-06-24 NOTE — Telephone Encounter (Signed)
[facility] called and states the pt is already active with them as of 01.17.23 and that there has been no change in any of his levels. He scheduled to see [facility] again in may on 2024

## 2022-06-28 NOTE — Progress Notes (Signed)
  Care Coordination   Note   06/28/2022 Name: [name] MRN: [id number] DOB: 04-03-1956  George Austin is a 66 y.o. year old male who sees Janith Lima, MD for primary care. I reached out to Loni Muse by phone today to offer care coordination services.  Mr. Shovlin was given information about Care Coordination services today including:   The Care Coordination services include support from the care team which includes your Nurse Coordinator, Clinical Social Worker, or Pharmacist.  The Care Coordination team is here to help remove barriers to the health concerns and goals most important to you. Care Coordination services are voluntary, and the patient may decline or stop services at any time by request to their care team member.   Care Coordination Consent Status: Patient did not agree to participate in care coordination services at this time.  Follow up plan:  pt appreciative but declines to schedule with Licensed Clinical SW at this time - call back info given - no further follow up indicated.   Encounter Outcome:  Pt. Refused  Julian Hy, [place] Direct Dial: [phone]

## 2022-07-03 ENCOUNTER — Other Ambulatory Visit: Payer: Self-pay | Admitting: *Deleted

## 2022-07-03 DIAGNOSIS — Z87891 Personal history of nicotine dependence: Secondary | ICD-10-CM

## 2022-07-03 DIAGNOSIS — F17210 Nicotine dependence, cigarettes, uncomplicated: Secondary | ICD-10-CM

## 2022-07-03 DIAGNOSIS — Z122 Encounter for screening for malignant neoplasm of respiratory organs: Secondary | ICD-10-CM

## 2022-08-01 ENCOUNTER — Other Ambulatory Visit: Payer: Self-pay | Admitting: Internal Medicine

## 2022-08-01 DIAGNOSIS — I10 Essential (primary) hypertension: Secondary | ICD-10-CM

## 2022-08-02 ENCOUNTER — Other Ambulatory Visit: Payer: Commercial Managed Care - HMO

## 2022-08-02 DIAGNOSIS — R972 Elevated prostate specific antigen [PSA]: Secondary | ICD-10-CM

## 2022-08-03 LAB — PSA: Prostate Specific Ag, Serum: 9 ng/mL — ABNORMAL HIGH (ref 0.0–4.0)

## 2022-08-07 ENCOUNTER — Ambulatory Visit: Payer: Commercial Managed Care - HMO | Admitting: Urology

## 2022-08-07 ENCOUNTER — Encounter: Payer: Self-pay | Admitting: Urology

## 2022-08-07 VITALS — BP 134/80 | HR 112 | Ht 67.0 in | Wt 130.0 lb

## 2022-08-07 DIAGNOSIS — R972 Elevated prostate specific antigen [PSA]: Secondary | ICD-10-CM

## 2022-08-07 NOTE — Progress Notes (Signed)
   08/07/2022 11:26 AM   Lequita Asal 24-Sep-1955 [id number]  Referring provider: Etta Grandchild, MD [address] [place],  [place] [address]  Chief Complaint  Patient presents with   Elevated PSA    Urologic history: 1.Elevated PSA Biopsy 01/24/2022 PSA 9.2/8.4 on repeat Volume 27 cc; Path benign prostate tissue  HPI: 66 y.o. male presents for 108-month follow-up  Doing well since last visit No bothersome LUTS Denies dysuria, gross hematuria Denies flank, abdominal or pelvic pain PSA 08/02/2022 stable at 9.0   PMH: Past Medical History:  Diagnosis Date   Allergy    Elevated serum creatinine 07/2019   Hyperlipidemia 07/2019   Hypertension    Vitamin D deficiency 07/2019    Surgical History: Past Surgical History:  Procedure Laterality Date   COLONOSCOPY     over 10 yrs ago in [place], normal per patient 08/30/19   CYST EXCISION Left    thigh   MULTIPLE TOOTH EXTRACTIONS     WISDOM TOOTH EXTRACTION      Home Medications:  Allergies as of 08/07/2022   No Known Allergies      Medication List        Accurate as of August 07, 2022 11:26 AM. If you have any questions, ask your nurse or doctor.          amLODipine 5 MG tablet Commonly known as: NORVASC TAKE 1 TABLET DAILY   Cholecalciferol 25 MCG (1000 UT) tablet Take by mouth.   ketoconazole 2 % shampoo Commonly known as: Nizoral Apply 1 Application topically 2 (two) times a week.   Magnesium Oxide 250 MG Tabs Take by mouth.   mometasone 50 MCG/ACT nasal spray Commonly known as: Nasonex Place 2 sprays into the nose daily.   thiamine 100 MG tablet Commonly known as: VITAMIN B1 Take 1 tablet (100 mg total) by mouth daily.   Vitamin D (Ergocalciferol) 1.25 MG (50000 UNIT) Caps capsule Commonly known as: DRISDOL Take 1 capsule (50,000 Units total) by mouth every 7 (seven) days.        Allergies: No Known Allergies  Family History: Family History  Problem Relation Age of Onset    Diabetes Mother    Stomach cancer Mother    Hypertension Father    Stroke Sister    Lupus Sister    Stroke Brother    Diabetes Brother    Colon cancer Neg Hx    Rectal cancer Neg Hx    Esophageal cancer Neg Hx     Social History:  reports that he has been smoking cigarettes. He has a 10.00 pack-year smoking history. He has never used smokeless tobacco. He reports current alcohol use of about 21.0 standard drinks of alcohol per week. He reports that he does not use drugs.   Physical Exam: BP 134/80   Pulse (!) 112   Ht 5\' 7"  (1.702 m)   Wt 130 lb (59 kg)   BMI 20.36 kg/m   Constitutional:  Alert and oriented, No acute distress. HEENT: [place] AT Respiratory: Normal respiratory effort, no increased work of breathing. Psychiatric: Normal mood and affect.  Assessment & Plan:    1.  Elevated PSA Stable, benign biopsy 01/2022 Recommend continue PSA monitoring and a follow-up visit was scheduled in 6 months with a PSA/DRE   02/2022, MD  [facility] [address], [address] [place], [place] [phone]

## 2022-08-08 ENCOUNTER — Ambulatory Visit (INDEPENDENT_AMBULATORY_CARE_PROVIDER_SITE_OTHER): Payer: Commercial Managed Care - HMO | Admitting: Primary Care

## 2022-08-08 DIAGNOSIS — F17200 Nicotine dependence, unspecified, uncomplicated: Secondary | ICD-10-CM

## 2022-08-08 NOTE — Progress Notes (Signed)
Virtual Visit via Telephone Note  I connected with George Austin on 08/08/22 at  8:30 AM EST by telephone and verified that I am speaking with the correct person using two identifiers.  Location: Patient: Home Provider: Office   I discussed the limitations, risks, security and privacy concerns of performing an evaluation and management service by telephone and the availability of in person appointments. I also discussed with the patient that there may be a patient responsible charge related to this service. The patient expressed understanding and agreed to proceed.   Shared Decision Making Visit Lung Cancer Screening Program [phone])   Eligibility: Age 66 y.o. Pack Years Smoking History Calculation 20 (# packs/per year x # years smoked) Recent History of coughing up blood  no Unexplained weight loss? no ( >Than 15 pounds within the last 6 months ) Prior History Lung / other cancer no (Diagnosis within the last 5 years already requiring surveillance chest CT Scans). Smoking Status Current Smoker Former Smokers: Years since quit: na  Quit Date: na  Visit Components: Discussion included one or more decision making aids. yes Discussion included risk/benefits of screening. yes Discussion included potential follow up diagnostic testing for abnormal scans. yes Discussion included meaning and risk of over diagnosis. yes Discussion included meaning and risk of False Positives. yes Discussion included meaning of total radiation exposure. yes  Counseling Included: Importance of adherence to annual lung cancer LDCT screening. yes Impact of comorbidities on ability to participate in the program. yes Ability and willingness to under diagnostic treatment. yes  Smoking Cessation Counseling: Current Smokers:  Discussed importance of smoking cessation. yes Information about tobacco cessation classes and interventions provided to patient. yes Patient provided with "ticket" for LDCT Scan.  Na  Symptomatic Patient. no  Counseling(Intermediate counseling: > three minutes) 99406 Diagnosis Code: Tobacco Use Z72.0 Asymptomatic Patient yes  Counseling (Intermediate counseling: > three minutes counseling) K3491 Former Smokers:  Discussed the importance of maintaining cigarette abstinence. yes Diagnosis Code: Personal History of Nicotine Dependence. P91.505 Information about tobacco cessation classes and interventions provided to patient. Yes Patient provided with "ticket" for LDCT Scan. na Written Order for Lung Cancer Screening with LDCT placed in Epic. Yes (CT Chest Lung Cancer Screening Low Dose W/O CM) [id number] Z12.2-Screening of respiratory organs Z87.891-Personal history of nicotine dependence   I have spent 25 minutes of face to face/ virtual visit   time with George Austin discussing the risks and benefits of lung cancer screening. We viewed / discussed a power point together that explained in detail the above noted topics. We paused at intervals to allow for questions to be asked and answered to ensure understanding.We discussed that the single most powerful action that he can take to decrease his risk of developing lung cancer is to quit smoking. We discussed whether or not he is ready to commit to setting a quit date. We discussed options for tools to aid in quitting smoking including nicotine replacement therapy, non-nicotine medications, support groups, Quit Smart classes, and behavior modification. We discussed that often times setting smaller, more achievable goals, such as eliminating 1 cigarette a day for a week and then 2 cigarettes a day for a week can be helpful in slowly decreasing the number of cigarettes smoked. This allows for a sense of accomplishment as well as providing a clinical benefit. I provided  him  with smoking cessation  information  with contact information for community resources, classes, free nicotine replacement therapy, and access to mobile apps, text  messaging, and on-line smoking cessation help. I have also provided  him  the office contact information in the event he needs to contact me, or the screening staff. We discussed the time and location of the scan, and that either Abigail Miyamoto RN, Karlton Lemon, RN  or I will call / send a letter with the results within 24-72 hours of receiving them. The patient verbalized understanding of all of  the above and had no further questions upon leaving the office. They have my contact information in the event they have any further questions.  I spent 3-5 minutes counseling on smoking cessation and the health risks of continued tobacco abuse.  I explained to the patient that there has been a high incidence of coronary artery disease noted on these exams. I explained that this is a non-gated exam therefore degree or severity cannot be determined. This patient is no on statin therapy. I have asked the patient to follow-up with their PCP regarding any incidental finding of coronary artery disease and management with diet or medication as their PCP  feels is clinically indicated. The patient verbalized understanding of the above and had no further questions upon completion of the visit.     Glenford Bayley, NP

## 2022-08-08 NOTE — Patient Instructions (Signed)
Thank you for participating in the [facility]. It was our pleasure to meet you today. We will call you with the results of your scan within the next few days. Your scan will be assigned a Lung RADS category score by the physicians reading the scans.  This Lung RADS score determines follow up scanning.  See below for description of categories, and follow up screening recommendations. We will be in touch to schedule your follow up screening annually or based on recommendations of our providers. We will fax a copy of your scan results to your Primary Care Physician, or the physician who referred you to the program, to ensure they have the results. Please call the office if you have any questions or concerns regarding your scanning experience or results.  Our office number is [phone]. Please speak with Denise Phelps, RN. , or  Denise Buckner RN, They are  our Lung Cancer Screening RN.'s If They are unavailable when you call, Please leave a message on the voice mail. We will return your call at our earliest convenience.This voice mail is monitored several times a day.  Remember, if your scan is normal, we will scan you annually as long as you continue to meet the criteria for the program. (Age 55-77, Current smoker or smoker who has quit within the last 15 years). If you are a smoker, remember, quitting is the single most powerful action that you can take to decrease your risk of lung cancer and other pulmonary, breathing related problems. We know quitting is hard, and we are here to help.  Please let us know if there is anything we can do to help you meet your goal of quitting. If you are a former smoker, congratulations. We are proud of you! Remain smoke free! Remember you can refer friends or family members through the number above.  We will screen them to make sure they meet criteria for the program. Thank you for helping us take better care of you by  participating in Lung Screening.  You can receive free nicotine replacement therapy ( patches, gum or mints) by calling 1-800-QUIT NOW. Please call so we can get you on the path to becoming  a non-smoker. I know it is hard, but you can do this!  Lung RADS Categories:  Lung RADS 1: no nodules or definitely non-concerning nodules.  Recommendation is for a repeat annual scan in 12 months.  Lung RADS 2:  nodules that are non-concerning in appearance and behavior with a very low likelihood of becoming an active cancer. Recommendation is for a repeat annual scan in 12 months.  Lung RADS 3: nodules that are probably non-concerning , includes nodules with a low likelihood of becoming an active cancer.  Recommendation is for a 6-month repeat screening scan. Often noted after an upper respiratory illness. We will be in touch to make sure you have no questions, and to schedule your 6-month scan.  Lung RADS 4 A: nodules with concerning findings, recommendation is most often for a follow up scan in 3 months or additional testing based on our provider's assessment of the scan. We will be in touch to make sure you have no questions and to schedule the recommended 3 month follow up scan.  Lung RADS 4 B:  indicates findings that are concerning. We will be in touch with you to schedule additional diagnostic testing based on our provider's  assessment of the scan.  Other options for assistance in smoking cessation (   As covered by your insurance benefits)  Hypnosis for smoking cessation  [employer] [phone]  Acupuncture for smoking cessation  [facility] [phone]   

## 2022-08-09 ENCOUNTER — Ambulatory Visit
Admission: RE | Admit: 2022-08-09 | Discharge: 2022-08-09 | Disposition: A | Discharge status: Home / Self Care | Payer: Commercial Managed Care - HMO | Source: Ambulatory Visit | Attending: Acute Care | Admitting: Acute Care

## 2022-08-09 DIAGNOSIS — Z122 Encounter for screening for malignant neoplasm of respiratory organs: Secondary | ICD-10-CM

## 2022-08-09 DIAGNOSIS — Z87891 Personal history of nicotine dependence: Secondary | ICD-10-CM

## 2022-08-09 DIAGNOSIS — F17210 Nicotine dependence, cigarettes, uncomplicated: Secondary | ICD-10-CM

## 2022-08-12 ENCOUNTER — Other Ambulatory Visit: Payer: Self-pay | Admitting: Acute Care

## 2022-08-12 DIAGNOSIS — F17210 Nicotine dependence, cigarettes, uncomplicated: Secondary | ICD-10-CM

## 2022-08-12 DIAGNOSIS — Z122 Encounter for screening for malignant neoplasm of respiratory organs: Secondary | ICD-10-CM

## 2022-08-12 DIAGNOSIS — Z87891 Personal history of nicotine dependence: Secondary | ICD-10-CM

## 2022-09-07 LAB — LAB REPORT - SCANNED
Albumin, Urine POC: 172.5
Creatinine, POC: 29.5 mg/dL
EGFR: 59
Microalb Creat Ratio: 585

## 2022-12-06 ENCOUNTER — Telehealth: Payer: Self-pay | Admitting: Internal Medicine

## 2022-12-06 NOTE — Telephone Encounter (Signed)
Prescription Request  12/06/2022  LOV: 06/13/2022  What is the name of the medication or equipment? amLODipine (NORVASC) 5 MG tablet   Have you contacted your pharmacy to request a refill? Yes   Which pharmacy would you like this sent to?  [facility] - [place], [place] - [address] [place] [address] Phone: [phone] Fax: [phone]    Patient notified that their request is being sent to the clinical staff for review and that they should receive a response within 2 business days.   Please advise at Mobile [phone] (mobile)

## 2022-12-08 ENCOUNTER — Other Ambulatory Visit: Payer: Self-pay | Admitting: Internal Medicine

## 2022-12-08 DIAGNOSIS — I10 Essential (primary) hypertension: Secondary | ICD-10-CM

## 2022-12-08 MED ORDER — AMLODIPINE BESYLATE 5 MG PO TABS
5.0000 mg | ORAL_TABLET | Freq: Every day | ORAL | 1 refills | Status: DC
  Filled 2022-12-08: qty 90, 90d supply, fill #0

## 2022-12-08 MED ORDER — AMLODIPINE BESYLATE 5 MG PO TABS: 5.0000 mg | ORAL_TABLET | Freq: Every day | ORAL | 1 refills | Status: DC

## 2022-12-09 ENCOUNTER — Other Ambulatory Visit: Payer: Self-pay

## 2022-12-11 ENCOUNTER — Telehealth: Payer: Self-pay | Admitting: Internal Medicine

## 2022-12-11 NOTE — Telephone Encounter (Signed)
Prescription Request  12/11/2022  LOV: 06/13/2022  What is the name of the medication or equipment?   amLODipine (NORVASC) 5 MG tablet  Pt [facility] received [facility] something happen with the mailing part. Pt want to know can Dr. Yetta Barre just send it to the pharmacy down below.   Have you contacted your pharmacy to request a refill? No   Which pharmacy would you like this sent to?   [facility] [address], [address], [place] [address] Phone: [phone]  Fax: [phone]   Patient notified that their request is being sent to the clinical staff for review and that they should receive a response within 2 business days.   Please advise at Mobile [phone] (mobile)

## 2022-12-12 ENCOUNTER — Other Ambulatory Visit: Payer: Self-pay

## 2022-12-12 ENCOUNTER — Other Ambulatory Visit: Payer: Self-pay | Admitting: Internal Medicine

## 2022-12-12 DIAGNOSIS — I10 Essential (primary) hypertension: Secondary | ICD-10-CM

## 2022-12-12 MED ORDER — AMLODIPINE BESYLATE 5 MG PO TABS
5.0000 mg | ORAL_TABLET | Freq: Every day | ORAL | 1 refills | Status: DC
Start: 2022-12-12 — End: 2022-12-20
  Filled 2022-12-12: qty 90, 90d supply, fill #0

## 2022-12-17 ENCOUNTER — Ambulatory Visit (INDEPENDENT_AMBULATORY_CARE_PROVIDER_SITE_OTHER): Payer: BLUE CROSS/BLUE SHIELD | Admitting: Internal Medicine

## 2022-12-17 ENCOUNTER — Encounter: Payer: Self-pay | Admitting: Internal Medicine

## 2022-12-17 ENCOUNTER — Other Ambulatory Visit: Payer: Self-pay

## 2022-12-17 VITALS — BP 144/82 | HR 68 | Temp 97.6°F | Ht 67.0 in | Wt 115.0 lb

## 2022-12-17 DIAGNOSIS — Z23 Encounter for immunization: Secondary | ICD-10-CM | POA: Diagnosis not present

## 2022-12-17 DIAGNOSIS — F1010 Alcohol abuse, uncomplicated: Secondary | ICD-10-CM

## 2022-12-17 DIAGNOSIS — R7989 Other specified abnormal findings of blood chemistry: Secondary | ICD-10-CM

## 2022-12-17 DIAGNOSIS — Z0001 Encounter for general adult medical examination with abnormal findings: Secondary | ICD-10-CM

## 2022-12-17 DIAGNOSIS — E785 Hyperlipidemia, unspecified: Secondary | ICD-10-CM | POA: Diagnosis not present

## 2022-12-17 DIAGNOSIS — Z0000 Encounter for general adult medical examination without abnormal findings: Secondary | ICD-10-CM

## 2022-12-17 DIAGNOSIS — I10 Essential (primary) hypertension: Secondary | ICD-10-CM

## 2022-12-17 DIAGNOSIS — R972 Elevated prostate specific antigen [PSA]: Secondary | ICD-10-CM

## 2022-12-17 DIAGNOSIS — N1832 Chronic kidney disease, stage 3b: Secondary | ICD-10-CM

## 2022-12-17 DIAGNOSIS — J431 Panlobular emphysema: Secondary | ICD-10-CM

## 2022-12-17 LAB — PROTIME-INR
INR: 0.9 ratio (ref 0.8–1.0)
Prothrombin Time: 9.4 s — ABNORMAL LOW (ref 9.6–13.1)

## 2022-12-17 LAB — URINALYSIS, ROUTINE W REFLEX MICROSCOPIC
Ketones, ur: 15 — AB
Leukocytes,Ua: NEGATIVE
Nitrite: NEGATIVE
Specific Gravity, Urine: 1.02 (ref 1.000–1.030)
Total Protein, Urine: 100 — AB
Urine Glucose: NEGATIVE
Urobilinogen, UA: 0.2 (ref 0.0–1.0)
pH: 6 (ref 5.0–8.0)

## 2022-12-17 LAB — HEPATIC FUNCTION PANEL
ALT: 15 U/L (ref 0–53)
AST: 29 U/L (ref 0–37)
Albumin: 4.7 g/dL (ref 3.5–5.2)
Alkaline Phosphatase: 103 U/L (ref 39–117)
Bilirubin, Direct: 0.2 mg/dL (ref 0.0–0.3)
Total Bilirubin: 0.7 mg/dL (ref 0.2–1.2)
Total Protein: 8.1 g/dL (ref 6.0–8.3)

## 2022-12-17 LAB — BASIC METABOLIC PANEL
BUN: 20 mg/dL (ref 6–23)
CO2: 24 mEq/L (ref 19–32)
Calcium: 10.2 mg/dL (ref 8.4–10.5)
Chloride: 89 mEq/L — ABNORMAL LOW (ref 96–112)
Creatinine, Ser: 1.59 mg/dL — ABNORMAL HIGH (ref 0.40–1.50)
GFR: 45.53 mL/min — ABNORMAL LOW (ref >60.00)
Glucose, Bld: 102 mg/dL — ABNORMAL HIGH (ref 70–99)
Potassium: 4.9 mEq/L (ref 3.5–5.1)
Sodium: 130 mEq/L — ABNORMAL LOW (ref 135–145)

## 2022-12-17 LAB — LIPID PANEL
Cholesterol: 238 mg/dL — ABNORMAL HIGH (ref 0–200)
HDL: 71.5 mg/dL (ref >39.00)
LDL Cholesterol: 141 mg/dL — ABNORMAL HIGH (ref 0–99)
NonHDL: 166.45
Total CHOL/HDL Ratio: 3
Triglycerides: 127 mg/dL (ref 0.0–149.0)
VLDL: 25.4 mg/dL (ref 0.0–40.0)

## 2022-12-17 LAB — CBC WITH DIFFERENTIAL/PLATELET
Basophils Absolute: 0 10*3/uL (ref 0.0–0.1)
Basophils Relative: 0.5 % (ref 0.0–3.0)
Eosinophils Absolute: 0.1 10*3/uL (ref 0.0–0.7)
Eosinophils Relative: 1.2 % (ref 0.0–5.0)
HCT: 42.5 % (ref 39.0–52.0)
Hemoglobin: 14.4 g/dL (ref 13.0–17.0)
Lymphocytes Relative: 26.5 % (ref 12.0–46.0)
Lymphs Abs: 1.9 10*3/uL (ref 0.7–4.0)
MCHC: 33.9 g/dL (ref 30.0–36.0)
MCV: 100.8 fl — ABNORMAL HIGH (ref 78.0–100.0)
Monocytes Absolute: 0.8 10*3/uL (ref 0.1–1.0)
Monocytes Relative: 10.7 % (ref 3.0–12.0)
Neutro Abs: 4.5 10*3/uL (ref 1.4–7.7)
Neutrophils Relative %: 61.1 % (ref 43.0–77.0)
Platelets: 259 10*3/uL (ref 150.0–400.0)
RBC: 4.22 Mil/uL (ref 4.22–5.81)
RDW: 16.7 % — ABNORMAL HIGH (ref 11.5–15.5)
WBC: 7.3 10*3/uL (ref 4.0–10.5)

## 2022-12-17 LAB — PSA: PSA: 10.69 ng/mL — ABNORMAL HIGH (ref 0.10–4.00)

## 2022-12-17 MED ORDER — ROSUVASTATIN CALCIUM 10 MG PO TABS
10.0000 mg | ORAL_TABLET | Freq: Every day | ORAL | 1 refills | Status: DC
Start: 2022-12-17 — End: 2023-09-01
  Filled 2022-12-17: qty 90, 90d supply, fill #0
  Filled 2023-04-18: qty 90, 90d supply, fill #1

## 2022-12-17 NOTE — Patient Instructions (Signed)
Health Maintenance, Male Adopting a healthy lifestyle and getting preventive care are important in promoting health and wellness. Ask your health care provider about: The right schedule for you to have regular tests and exams. Things you can do on your own to prevent diseases and keep yourself healthy. What should I know about diet, weight, and exercise? Eat a healthy diet  Eat a diet that includes plenty of vegetables, fruits, low-fat dairy products, and lean protein. Do not eat a lot of foods that are high in solid fats, added sugars, or sodium. Maintain a healthy weight Body mass index (BMI) is a measurement that can be used to identify possible weight problems. It estimates body fat based on height and weight. Your health care provider can help determine your BMI and help you achieve or maintain a healthy weight. Get regular exercise Get regular exercise. This is one of the most important things you can do for your health. Most adults should: Exercise for at least 150 minutes each week. The exercise should increase your heart rate and make you sweat (moderate-intensity exercise). Do strengthening exercises at least twice a week. This is in addition to the moderate-intensity exercise. Spend less time sitting. Even light physical activity can be beneficial. Watch cholesterol and blood lipids Have your blood tested for lipids and cholesterol at 65 years of age, then have this test every 5 years. You may need to have your cholesterol levels checked more often if: Your lipid or cholesterol levels are high. You are older than 65 years of age. You are at high risk for heart disease. What should I know about cancer screening? Many types of cancers can be detected early and may often be prevented. Depending on your health history and family history, you may need to have cancer screening at various ages. This may include screening for: Colorectal cancer. Prostate cancer. Skin cancer. Lung  cancer. What should I know about heart disease, diabetes, and high blood pressure? Blood pressure and heart disease High blood pressure causes heart disease and increases the risk of stroke. This is more likely to develop in people who have high blood pressure readings or are overweight. Talk with your health care provider about your target blood pressure readings. Have your blood pressure checked: Every 3-5 years if you are 18-39 years of age. Every year if you are 40 years old or older. If you are between the ages of 65 and 75 and are a current or former smoker, ask your health care provider if you should have a one-time screening for abdominal aortic aneurysm (AAA). Diabetes Have regular diabetes screenings. This checks your fasting blood sugar level. Have the screening done: Once every three years after age 45 if you are at a normal weight and have a low risk for diabetes. More often and at a younger age if you are overweight or have a high risk for diabetes. What should I know about preventing infection? Hepatitis B If you have a higher risk for hepatitis B, you should be screened for this virus. Talk with your health care provider to find out if you are at risk for hepatitis B infection. Hepatitis C Blood testing is recommended for: Everyone born from 1945 through 1965. Anyone with known risk factors for hepatitis C. Sexually transmitted infections (STIs) You should be screened each year for STIs, including gonorrhea and chlamydia, if: You are sexually active and are younger than 65 years of age. You are older than 65 years of age and your   health care provider tells you that you are at risk for this type of infection. Your sexual activity has changed since you were last screened, and you are at increased risk for chlamydia or gonorrhea. Ask your health care provider if you are at risk. Ask your health care provider about whether you are at high risk for HIV. Your health care provider  may recommend a prescription medicine to help prevent HIV infection. If you choose to take medicine to prevent HIV, you should first get tested for HIV. You should then be tested every 3 months for as long as you are taking the medicine. Follow these instructions at home: Alcohol use Do not drink alcohol if your health care provider tells you not to drink. If you drink alcohol: Limit how much you have to 0-2 drinks a day. Know how much alcohol is in your drink. In the [place] one drink equals one 12 oz bottle of beer (355 mL), one 5 oz glass of wine (148 mL), or one 1 oz glass of hard liquor (44 mL). Lifestyle Do not use any products that contain nicotine or tobacco. These products include cigarettes, chewing tobacco, and vaping devices, such as e-cigarettes. If you need help quitting, ask your health care provider. Do not use street drugs. Do not share needles. Ask your health care provider for help if you need support or information about quitting drugs. General instructions Schedule regular health, dental, and eye exams. Stay current with your vaccines. Tell your health care provider if: You often feel depressed. You have ever been abused or do not feel safe at home. Summary Adopting a healthy lifestyle and getting preventive care are important in promoting health and wellness. Follow your health care provider's instructions about healthy diet, exercising, and getting tested or screened for diseases. Follow your health care provider's instructions on monitoring your cholesterol and blood pressure. This information is not intended to replace advice given to you by your health care provider. Make sure you discuss any questions you have with your health care provider. Document Revised: 01/01/2021 Document Reviewed: 01/01/2021 Elsevier Patient Education  2023 [employer]  

## 2022-12-17 NOTE — Progress Notes (Signed)
Subjective:  Patient ID: George Austin, male    DOB: March 08, 1956  Age: 67 y.o. MRN: [id number]  CC: Annual Exam and Hypertension   HPI George Austin presents for a CPX and f/up ---   He is active and denies chest pain, shortness of breath, diaphoresis, cough, shortness of breath, or wheezing.  He complains of weight loss and rare episodes of dizziness.  Outpatient Medications Prior to Visit  Medication Sig Dispense Refill   Cholecalciferol 25 MCG (1000 UT) tablet Take by mouth.     ketoconazole (NIZORAL) 2 % shampoo Apply 1 Application topically 2 (two) times a week. 120 mL 0   Magnesium Oxide 250 MG TABS Take by mouth.     mometasone (NASONEX) 50 MCG/ACT nasal spray Place 2 sprays into the nose daily. (Patient taking differently: Place 2 sprays into the nose daily. As needed) 17 g 12   thiamine (VITAMIN B1) 100 MG tablet Take 1 tablet (100 mg total) by mouth daily. 90 tablet 1   Vitamin D, Ergocalciferol, (DRISDOL) 1.25 MG (50000 UT) CAPS capsule Take 1 capsule (50,000 Units total) by mouth every 7 (seven) days. 5 capsule 6   amLODipine (NORVASC) 5 MG tablet Take 1 tablet (5 mg total) by mouth daily. 90 tablet 1   No facility-administered medications prior to visit.    ROS Review of Systems  Constitutional:  Positive for unexpected weight change. Negative for appetite change, chills, diaphoresis and fatigue.  HENT: Negative.    Eyes: Negative.   Respiratory: Negative.  Negative for cough, chest tightness, shortness of breath and wheezing.   Cardiovascular:  Negative for chest pain, palpitations and leg swelling.  Gastrointestinal:  Negative for abdominal pain, constipation, diarrhea, nausea and vomiting.  Endocrine: Negative.   Genitourinary: Negative.  Negative for difficulty urinating and dysuria.  Skin: Negative.   Neurological:  Positive for weakness. Negative for dizziness, light-headedness and numbness.  Hematological:  Negative for adenopathy. Does not bruise/bleed  easily.  Psychiatric/Behavioral: Negative.      Objective:  BP (!) 144/82 (BP Location: Left Arm, Patient Position: Sitting, Cuff Size: Large)   Pulse 68   Temp 97.6 F (36.4 C) (Oral)   Ht 5\' 7"  (1.702 m)   Wt 115 lb (52.2 kg)   SpO2 92%   BMI 18.01 kg/m   BP Readings from Last 3 Encounters:  12/17/22 (!) 144/82  08/07/22 134/80  06/13/22 (!) 144/86    Wt Readings from Last 3 Encounters:  12/17/22 115 lb (52.2 kg)  08/07/22 130 lb (59 kg)  06/13/22 109 lb (49.4 kg)    Physical Exam Vitals reviewed.  Constitutional:      General: He is not in acute distress.    Appearance: He is ill-appearing. He is not toxic-appearing or diaphoretic.  HENT:     Nose: Nose normal.     Mouth/Throat:     Mouth: Mucous membranes are moist.  Eyes:     General: No scleral icterus. Cardiovascular:     Rate and Rhythm: Tachycardia present.     Heart sounds: No murmur heard.    No friction rub. No gallop.     Comments: EKG- NSR, 97 bpm BAE NS ST/T wave changes No LVH or Q waves Pulmonary:     Effort: Pulmonary effort is normal.     Breath sounds: Examination of the right-upper field reveals decreased breath sounds. Examination of the left-upper field reveals decreased breath sounds. Examination of the right-middle field reveals decreased breath sounds. Examination  of the left-middle field reveals decreased breath sounds. Examination of the right-lower field reveals decreased breath sounds. Examination of the left-lower field reveals decreased breath sounds. Decreased breath sounds present. No wheezing, rhonchi or rales.  Abdominal:     General: Abdomen is flat.     Palpations: There is no mass.     Tenderness: There is no abdominal tenderness. There is no guarding.     Hernia: No hernia is present.  Musculoskeletal:     Cervical back: Neck supple. No tenderness.     Right lower leg: No edema.     Left lower leg: No edema.  Lymphadenopathy:     Cervical: No cervical adenopathy.   Skin:    General: Skin is warm and dry.  Neurological:     General: No focal deficit present.     Mental Status: He is alert. Mental status is at baseline.     Motor: Weakness and atrophy present.     Coordination: Coordination is intact.     Gait: Gait abnormal.  Psychiatric:        Mood and Affect: Mood normal.        Behavior: Behavior normal.     Lab Results  Component Value Date   WBC 7.3 12/17/2022   HGB 14.4 12/17/2022   HCT 42.5 12/17/2022   PLT 259.0 12/17/2022   GLUCOSE 102 (H) 12/17/2022   CHOL 238 (H) 12/17/2022   TRIG 127.0 12/17/2022   HDL 71.50 12/17/2022   LDLCALC 141 (H) 12/17/2022   ALT 15 12/17/2022   AST 29 12/17/2022   NA 130 (L) 12/17/2022   K 4.9 12/17/2022   CL 89 (L) 12/17/2022   CREATININE 1.59 (H) 12/17/2022   BUN 20 12/17/2022   CO2 24 12/17/2022   TSH 0.86 06/13/2022   PSA 10.69 (H) 12/17/2022   INR 0.9 12/17/2022   HGBA1C 5.0 07/28/2019    CT CHEST LUNG CA SCREEN LOW DOSE W/O CM  Result Date: 08/11/2022 CLINICAL DATA:  67 year old male current smoker with 20 pack-year history of smoking. Lung cancer screening examination. EXAM: CT CHEST WITHOUT CONTRAST LOW-DOSE FOR LUNG CANCER SCREENING TECHNIQUE: Multidetector CT imaging of the chest was performed following the standard protocol without IV contrast. RADIATION DOSE REDUCTION: This exam was performed according to the departmental dose-optimization program which includes automated exposure control, adjustment of the mA and/or kV according to patient size and/or use of iterative reconstruction technique. COMPARISON:  No priors. FINDINGS: Cardiovascular: Heart size is normal. There is no significant pericardial fluid, thickening or pericardial calcification. There is aortic atherosclerosis, as well as atherosclerosis of the great vessels of the mediastinum and the coronary arteries, including calcified atherosclerotic plaque in the left main and left circumflex coronary arteries.  Mediastinum/Nodes: No pathologically enlarged mediastinal or hilar lymph nodes. Please note that accurate exclusion of hilar adenopathy is limited on noncontrast CT scans. Esophagus is unremarkable in appearance. No axillary lymphadenopathy. Lungs/Pleura: Tiny pulmonary nodule in the left lower lobe (axial image 263), with a volume derived mean diameter of only 3.9 mm. No larger more suspicious appearing pulmonary nodules or masses are noted. No acute consolidative airspace disease. No pleural effusions. Mild diffuse bronchial wall thickening with mild centrilobular and paraseptal emphysema. Upper Abdomen: Aortic atherosclerosis. Musculoskeletal: There are no aggressive appearing lytic or blastic lesions noted in the visualized portions of the skeleton. IMPRESSION: 1. Lung-RADS 2S, benign appearance or behavior. Continue annual screening with low-dose chest CT without contrast in 12 months. 2. The "S" modifier above  refers to potentially clinically significant non lung cancer related findings. Specifically, there is aortic atherosclerosis, in addition to left main and left circumflex coronary artery disease. Please note that although the presence of coronary artery calcium documents the presence of coronary artery disease, the severity of this disease and any potential stenosis cannot be assessed on this non-gated CT examination. Assessment for potential risk factor modification, dietary therapy or pharmacologic therapy may be warranted, if clinically indicated. 3. Mild diffuse bronchial wall thickening with mild centrilobular and paraseptal emphysema; imaging findings suggestive of underlying COPD. Aortic Atherosclerosis (ICD10-I70.0) and Emphysema (ICD10-J43.9). Electronically Signed   By: Trudie Reed M.D.   On: 08/11/2022 10:44   Assessment & Plan:   Primary hypertension- His blood pressure is not adequately well-controlled.  Will increase the dose of amlodipine. -     Urinalysis, Routine w reflex  microscopic; Future -     CBC with Differential/Platelet; Future -     Basic metabolic panel; Future -     EKG 12-Lead -     amLODIPine Besylate; Take 1 tablet (10 mg total) by mouth daily.  Dispense: 90 tablet; Refill: 1  Hyperlipidemia LDL goal <130- Will start a statin for cardiovascular risk reduction. -     Lipid panel; Future -     Rosuvastatin Calcium; Take 1 tablet (10 mg total) by mouth daily.  Dispense: 90 tablet; Refill: 1  PSA elevation- He is followed by urology. -     PSA; Future  Stage 3b chronic kidney disease (HCC)- His renal function has declined.  Will try to get better control of his blood pressure. -     Urinalysis, Routine w reflex microscopic; Future -     Basic metabolic panel; Future  Alcohol abuse- I encouraged him to abstain from alcohol intake. -     Hepatic function panel; Future  Elevated LFTs -     Hepatic function panel; Future -     Protime-INR; Future  Encounter for general adult medical examination with abnormal findings- Exam completed, labs reviewed, vaccines reviewed and updated, cancer screenings are up-to-date, patient education was given.  Panlobular emphysema (HCC)- I encouraged him to quit smoking.  Other orders -     Tdap vaccine greater than or equal to 7yo IM     Follow-up: Return in about 6 months (around 06/18/2023).  George Linger, MD

## 2022-12-19 ENCOUNTER — Other Ambulatory Visit: Payer: Self-pay

## 2022-12-20 ENCOUNTER — Other Ambulatory Visit: Payer: Self-pay

## 2022-12-20 MED ORDER — AMLODIPINE BESYLATE 10 MG PO TABS
10.0000 mg | ORAL_TABLET | Freq: Every day | ORAL | 1 refills | Status: DC
Start: 2022-12-20 — End: 2023-08-26
  Filled 2022-12-20 – 2023-02-05 (×2): qty 90, 90d supply, fill #0
  Filled 2023-04-18: qty 90, 90d supply, fill #1

## 2022-12-25 ENCOUNTER — Other Ambulatory Visit: Payer: Self-pay

## 2023-01-27 ENCOUNTER — Other Ambulatory Visit: Payer: Self-pay | Admitting: Family Medicine

## 2023-01-27 DIAGNOSIS — R972 Elevated prostate specific antigen [PSA]: Secondary | ICD-10-CM

## 2023-01-29 DIAGNOSIS — M720 Palmar fascial fibromatosis [Dupuytren]: Secondary | ICD-10-CM | POA: Insufficient documentation

## 2023-02-03 ENCOUNTER — Other Ambulatory Visit: Payer: BLUE CROSS/BLUE SHIELD

## 2023-02-03 DIAGNOSIS — R972 Elevated prostate specific antigen [PSA]: Secondary | ICD-10-CM

## 2023-02-03 LAB — PSA: PSA: 10

## 2023-02-04 LAB — PSA: Prostate Specific Ag, Serum: 10.1 ng/mL — ABNORMAL HIGH (ref 0.0–4.0)

## 2023-02-05 ENCOUNTER — Other Ambulatory Visit: Payer: Self-pay

## 2023-02-06 ENCOUNTER — Ambulatory Visit: Payer: Commercial Managed Care - HMO | Admitting: Urology

## 2023-02-17 ENCOUNTER — Encounter: Payer: Self-pay | Admitting: Urology

## 2023-02-17 ENCOUNTER — Ambulatory Visit (INDEPENDENT_AMBULATORY_CARE_PROVIDER_SITE_OTHER): Payer: BLUE CROSS/BLUE SHIELD | Admitting: Urology

## 2023-02-17 VITALS — BP 123/80 | HR 99 | Ht 67.0 in | Wt 125.0 lb

## 2023-02-17 DIAGNOSIS — R972 Elevated prostate specific antigen [PSA]: Secondary | ICD-10-CM

## 2023-02-17 NOTE — Progress Notes (Signed)
I, Maysun L Gibbs,acting as a scribe for Riki Altes, MD.,have documented all relevant documentation on the behalf of Riki Altes, MD,as directed by  Riki Altes, MD while in the presence of Riki Altes, MD.  02/17/2023 4:02 PM   Lequita Asal 02-Nov-1955 [id number]  Referring provider: Etta Grandchild, MD [address] [place],  [place] [address]  Chief Complaint  Patient presents with   Elevated PSA   Urologic history: Elevated PSA Biopsy 01/24/2022 PSA 9.2/8.4 on repeat Volume 27 cc; Path benign prostate tissue  HPI: JABRON WEESE is a 67 y.o. male presents for 6 month follow-up of an elevated PSA.   Doing well since last visit No bothersome LUTS Denies dysuria, gross hematuria Denies flank, abdominal or pelvic pain PSA 02/03/2023 increased at 10.1  PSA trend   Prostate Specific Ag, Serum  Latest Ref Rng 0.0 - 4.0 ng/mL  07/28/2019 4.5 (H)   12/07/2021 8.4 (H)   08/02/2022 9.0 (H)   02/03/2023 10.1 (H)       PMH: Past Medical History:  Diagnosis Date   Allergy    Elevated serum creatinine 07/2019   Hyperlipidemia 07/2019   Hypertension    Vitamin D deficiency 07/2019    Surgical History: Past Surgical History:  Procedure Laterality Date   COLONOSCOPY     over 10 yrs ago in [place], normal per patient 08/30/19   CYST EXCISION Left    thigh   MULTIPLE TOOTH EXTRACTIONS     WISDOM TOOTH EXTRACTION      Home Medications:  Allergies as of 02/17/2023   No Known Allergies      Medication List        Accurate as of February 17, 2023  4:02 PM. If you have any questions, ask your nurse or doctor.          amLODipine 10 MG tablet Commonly known as: NORVASC Take 1 tablet (10 mg total) by mouth daily.   Cholecalciferol 25 MCG (1000 UT) tablet Take by mouth.   ketoconazole 2 % shampoo Commonly known as: Nizoral Apply 1 Application topically 2 (two) times a week.   Magnesium Oxide 250 MG Tabs Take by mouth.   mometasone 50  MCG/ACT nasal spray Commonly known as: Nasonex Place 2 sprays into the nose daily. What changed: additional instructions   rosuvastatin 10 MG tablet Commonly known as: Crestor Take 1 tablet (10 mg total) by mouth daily.   thiamine 100 MG tablet Commonly known as: VITAMIN B1 Take 1 tablet (100 mg total) by mouth daily.   Vitamin D (Ergocalciferol) 1.25 MG (50000 UNIT) Caps capsule Commonly known as: DRISDOL Take 1 capsule (50,000 Units total) by mouth every 7 (seven) days.        Allergies: No Known Allergies  Family History: Family History  Problem Relation Age of Onset   Diabetes Mother    Stomach cancer Mother    Hypertension Father    Stroke Sister    Lupus Sister    Stroke Brother    Diabetes Brother    Colon cancer Neg Hx    Rectal cancer Neg Hx    Esophageal cancer Neg Hx     Social History:  reports that he has been smoking cigarettes. He has a 10.00 pack-year smoking history. He has never used smokeless tobacco. He reports current alcohol use of about 21.0 standard drinks of alcohol per week. He reports that he does not use drugs.   Physical  Exam: BP 123/80   Pulse 99   Ht 5\' 7"  (1.702 m)   Wt 125 lb (56.7 kg)   BMI 19.58 kg/m   Constitutional:  Alert and oriented, No acute distress. HEENT: [place] AT, moist mucus membranes.  Trachea midline, no masses. Cardiovascular: No clubbing, cyanosis, or edema. Respiratory: Normal respiratory effort, no increased work of breathing. GI: Abdomen is soft, nontender, nondistended, no abdominal masses GU: Prostate 30 grams, smooth without nodules. Skin: No rashes, bruises or suspicious lesions. Neurologic: Grossly intact, no focal deficits, moving all 4 extremities. Psychiatric: Normal mood and affect.   Urinalysis Prostate 30 grams, smooth without nodules.  Assessment & Plan:    1. Elevated PSA PSA elevated above baseline. Benign DRE Schedule a follow up prostate MRI- order placed and we'll call with  results.  [facility] [address] [place], [place] [address] [phone]

## 2023-02-17 NOTE — Patient Instructions (Signed)
Prostate MRI Prep:  1- No ejaculation 48 hours prior to exam  2- No caffeine or carbonated beverages on day of the exam  3- Eat light diet evening prior and day of exam  4- Avoid eating 4 hours prior to exam  5- Fleets enema needs to be done 4 hours prior to exam -See below. Can be purchased at the drug store.      

## 2023-02-19 NOTE — Addendum Note (Signed)
Addended by: Consuella Lose on: 02/19/2023 03:43 PM   Modules accepted: Orders

## 2023-02-25 ENCOUNTER — Telehealth: Payer: Self-pay

## 2023-02-25 NOTE — Telephone Encounter (Signed)
Received a call from Fannie Knee at [facility] in regards to the order they received for MRI of prostate for the patient. Fannie Knee states they do not have the ability to do that type of scan for the prostate at the location. With patient's insurance he should be able to go to [facility] facility to get this done and stated it needs to be a 3T machine for this and they do not have that.

## 2023-03-05 ENCOUNTER — Ambulatory Visit (HOSPITAL_COMMUNITY): Payer: BLUE CROSS/BLUE SHIELD

## 2023-04-04 DIAGNOSIS — I129 Hypertensive chronic kidney disease with stage 1 through stage 4 chronic kidney disease, or unspecified chronic kidney disease: Secondary | ICD-10-CM | POA: Diagnosis not present

## 2023-04-04 DIAGNOSIS — N2889 Other specified disorders of kidney and ureter: Secondary | ICD-10-CM | POA: Diagnosis not present

## 2023-04-04 DIAGNOSIS — E46 Unspecified protein-calorie malnutrition: Secondary | ICD-10-CM | POA: Diagnosis not present

## 2023-04-04 DIAGNOSIS — N1831 Chronic kidney disease, stage 3a: Secondary | ICD-10-CM | POA: Diagnosis not present

## 2023-04-04 DIAGNOSIS — E785 Hyperlipidemia, unspecified: Secondary | ICD-10-CM | POA: Diagnosis not present

## 2023-04-04 LAB — LAB REPORT - SCANNED
Albumin, Urine POC: 925.6
Albumin/Creatinine Ratio, Urine, POC: 295
Creatinine, POC: 314.2 mg/dL
EGFR: 46

## 2023-04-07 LAB — BASIC METABOLIC PANEL
BUN: 10 (ref 4–21)
CO2: 28 — AB (ref 13–22)
Chloride: 95 — AB (ref 99–108)
Creatinine: 1.5 — AB (ref 0.6–1.3)
Glucose: 83
Potassium: 4 meq/L (ref 3.5–5.1)
Sodium: 138 (ref 137–147)

## 2023-04-07 LAB — COMPREHENSIVE METABOLIC PANEL
Albumin: 4.6 (ref 3.5–5.0)
Calcium: 10.2 (ref 8.7–10.7)
eGFR: 46

## 2023-04-18 ENCOUNTER — Other Ambulatory Visit: Payer: Self-pay

## 2023-04-21 ENCOUNTER — Other Ambulatory Visit: Payer: Self-pay

## 2023-04-24 DIAGNOSIS — N189 Chronic kidney disease, unspecified: Secondary | ICD-10-CM | POA: Diagnosis not present

## 2023-04-24 DIAGNOSIS — Z809 Family history of malignant neoplasm, unspecified: Secondary | ICD-10-CM | POA: Diagnosis not present

## 2023-04-24 DIAGNOSIS — Z8673 Personal history of transient ischemic attack (TIA), and cerebral infarction without residual deficits: Secondary | ICD-10-CM | POA: Diagnosis not present

## 2023-04-24 DIAGNOSIS — Z833 Family history of diabetes mellitus: Secondary | ICD-10-CM | POA: Diagnosis not present

## 2023-04-24 DIAGNOSIS — Z720 Tobacco use: Secondary | ICD-10-CM | POA: Diagnosis not present

## 2023-04-24 DIAGNOSIS — Z8249 Family history of ischemic heart disease and other diseases of the circulatory system: Secondary | ICD-10-CM | POA: Diagnosis not present

## 2023-04-24 DIAGNOSIS — M1990 Unspecified osteoarthritis, unspecified site: Secondary | ICD-10-CM | POA: Diagnosis not present

## 2023-04-24 DIAGNOSIS — E785 Hyperlipidemia, unspecified: Secondary | ICD-10-CM | POA: Diagnosis not present

## 2023-04-24 DIAGNOSIS — I129 Hypertensive chronic kidney disease with stage 1 through stage 4 chronic kidney disease, or unspecified chronic kidney disease: Secondary | ICD-10-CM | POA: Diagnosis not present

## 2023-04-24 DIAGNOSIS — I739 Peripheral vascular disease, unspecified: Secondary | ICD-10-CM | POA: Diagnosis not present

## 2023-04-24 DIAGNOSIS — F1010 Alcohol abuse, uncomplicated: Secondary | ICD-10-CM | POA: Diagnosis not present

## 2023-04-24 DIAGNOSIS — Z008 Encounter for other general examination: Secondary | ICD-10-CM | POA: Diagnosis not present

## 2023-06-10 DIAGNOSIS — H52223 Regular astigmatism, bilateral: Secondary | ICD-10-CM | POA: Diagnosis not present

## 2023-06-23 ENCOUNTER — Ambulatory Visit (INDEPENDENT_AMBULATORY_CARE_PROVIDER_SITE_OTHER): Payer: Medicare HMO | Admitting: Internal Medicine

## 2023-06-23 ENCOUNTER — Encounter: Payer: Self-pay | Admitting: Internal Medicine

## 2023-06-23 VITALS — BP 138/82 | HR 90 | Temp 98.0°F | Resp 16 | Ht 67.0 in | Wt 116.0 lb

## 2023-06-23 DIAGNOSIS — E785 Hyperlipidemia, unspecified: Secondary | ICD-10-CM

## 2023-06-23 DIAGNOSIS — Z23 Encounter for immunization: Secondary | ICD-10-CM | POA: Insufficient documentation

## 2023-06-23 DIAGNOSIS — I10 Essential (primary) hypertension: Secondary | ICD-10-CM

## 2023-06-23 DIAGNOSIS — F1010 Alcohol abuse, uncomplicated: Secondary | ICD-10-CM | POA: Diagnosis not present

## 2023-06-23 DIAGNOSIS — R0609 Other forms of dyspnea: Secondary | ICD-10-CM | POA: Diagnosis not present

## 2023-06-23 DIAGNOSIS — J300 Vasomotor rhinitis: Secondary | ICD-10-CM

## 2023-06-23 DIAGNOSIS — R9431 Abnormal electrocardiogram [ECG] [EKG]: Secondary | ICD-10-CM

## 2023-06-23 LAB — CBC WITH DIFFERENTIAL/PLATELET
Basophils Absolute: 0.1 10*3/uL (ref 0.0–0.1)
Basophils Relative: 1 % (ref 0.0–3.0)
Eosinophils Absolute: 0.1 10*3/uL (ref 0.0–0.7)
Eosinophils Relative: 1.4 % (ref 0.0–5.0)
HCT: 39.3 % (ref 39.0–52.0)
Hemoglobin: 12.9 g/dL — ABNORMAL LOW (ref 13.0–17.0)
Lymphocytes Relative: 21.7 % (ref 12.0–46.0)
Lymphs Abs: 2 10*3/uL (ref 0.7–4.0)
MCHC: 32.9 g/dL (ref 30.0–36.0)
MCV: 99.3 fL (ref 78.0–100.0)
Monocytes Absolute: 1 10*3/uL (ref 0.1–1.0)
Monocytes Relative: 10.4 % (ref 3.0–12.0)
Neutro Abs: 6.1 10*3/uL (ref 1.4–7.7)
Neutrophils Relative %: 65.5 % (ref 43.0–77.0)
Platelets: 316 10*3/uL (ref 150.0–400.0)
RBC: 3.95 Mil/uL — ABNORMAL LOW (ref 4.22–5.81)
RDW: 16.5 % — ABNORMAL HIGH (ref 11.5–15.5)
WBC: 9.3 10*3/uL (ref 4.0–10.5)

## 2023-06-23 LAB — HEPATIC FUNCTION PANEL
ALT: 15 U/L (ref 0–53)
AST: 30 U/L (ref 0–37)
Albumin: 4.5 g/dL (ref 3.5–5.2)
Alkaline Phosphatase: 110 U/L (ref 39–117)
Bilirubin, Direct: 0 mg/dL (ref 0.0–0.3)
Total Bilirubin: 0.3 mg/dL (ref 0.2–1.2)
Total Protein: 8.2 g/dL (ref 6.0–8.3)

## 2023-06-23 LAB — BRAIN NATRIURETIC PEPTIDE: Pro B Natriuretic peptide (BNP): 52 pg/mL (ref 0.0–100.0)

## 2023-06-23 LAB — TROPONIN I (HIGH SENSITIVITY): High Sens Troponin I: 8 ng/L (ref 2–17)

## 2023-06-23 LAB — TSH: TSH: 1.85 u[IU]/mL (ref 0.35–5.50)

## 2023-06-23 LAB — PROTIME-INR
INR: 0.9 {ratio} (ref 0.8–1.0)
Prothrombin Time: 9.4 s — ABNORMAL LOW (ref 9.6–13.1)

## 2023-06-23 MED ORDER — AZELASTINE HCL 0.1 % NA SOLN: 1.0000 | Freq: Two times a day (BID) | NASAL | 1 refills | Status: AC

## 2023-06-23 NOTE — Progress Notes (Unsigned)
Subjective:  Patient ID: George Austin, male    DOB: 05-23-1956  Age: 67 y.o. MRN: [id number]  CC: Hypertension and Hyperlipidemia   HPI JAREE REMPE presents for f/up ----  Discussed the use of AI scribe software for clinical note transcription with the patient, who gave verbal consent to proceed.  History of Present Illness   The patient, a current smoker, complains of chronic NP cough and DOE.       Outpatient Medications Prior to Visit  Medication Sig Dispense Refill   amLODipine (NORVASC) 10 MG tablet Take 1 tablet (10 mg total) by mouth daily. 90 tablet 1   Cholecalciferol 25 MCG (1000 UT) tablet Take by mouth.     ketoconazole (NIZORAL) 2 % shampoo Apply 1 Application topically 2 (two) times a week. 120 mL 0   Magnesium Oxide 250 MG TABS Take by mouth.     mometasone (NASONEX) 50 MCG/ACT nasal spray Place 2 sprays into the nose daily. (Patient taking differently: Place 2 sprays into the nose daily. As needed) 17 g 12   rosuvastatin (CRESTOR) 10 MG tablet Take 1 tablet (10 mg total) by mouth daily. 90 tablet 1   thiamine (VITAMIN B1) 100 MG tablet Take 1 tablet (100 mg total) by mouth daily. 90 tablet 1   Vitamin D, Ergocalciferol, (DRISDOL) 1.25 MG (50000 UT) CAPS capsule Take 1 capsule (50,000 Units total) by mouth every 7 (seven) days. 5 capsule 6   No facility-administered medications prior to visit.    ROS Review of Systems  Constitutional:  Positive for fatigue and unexpected weight change (wt loss). Negative for appetite change, chills and diaphoresis.  HENT:  Positive for congestion and rhinorrhea. Negative for facial swelling, postnasal drip, sinus pain, sneezing, sore throat and trouble swallowing.   Respiratory:  Positive for cough and shortness of breath. Negative for chest tightness and wheezing.   Cardiovascular:  Negative for chest pain, palpitations and leg swelling.  Gastrointestinal:  Positive for constipation. Negative for abdominal pain,  diarrhea, nausea and vomiting.  Genitourinary: Negative.  Negative for difficulty urinating.  Musculoskeletal: Negative.   Skin: Negative.   Neurological:  Positive for weakness. Negative for dizziness.  Psychiatric/Behavioral:  Positive for confusion and decreased concentration. Negative for self-injury. The patient is not nervous/anxious.     Objective:  BP 138/82 (BP Location: Left Arm, Patient Position: Sitting, Cuff Size: Normal)   Pulse 90   Temp 98 F (36.7 C) (Oral)   Resp 16   Ht 5\' 7"  (1.702 m)   Wt 116 lb (52.6 kg)   SpO2 99%   BMI 18.17 kg/m   BP Readings from Last 3 Encounters:  06/23/23 138/82  02/17/23 123/80  12/17/22 (!) 144/82    Wt Readings from Last 3 Encounters:  06/23/23 116 lb (52.6 kg)  02/17/23 125 lb (56.7 kg)  12/17/22 115 lb (52.2 kg)    Physical Exam Vitals reviewed.  Constitutional:      General: He is not in acute distress.    Appearance: He is cachectic. He is ill-appearing. He is not toxic-appearing or diaphoretic.  HENT:     Nose: Nose normal.     Mouth/Throat:     Mouth: Mucous membranes are moist.  Eyes:     General: No scleral icterus.    Conjunctiva/sclera: Conjunctivae normal.  Cardiovascular:     Rate and Rhythm: Normal rate and regular rhythm. Occasional Extrasystoles are present.    Pulses:  Dorsalis pedis pulses are 1+ on the right side and 1+ on the left side.       Posterior tibial pulses are 1+ on the right side and 1+ on the left side.     Heart sounds: No murmur heard.    No gallop.     Comments: EKG- SR with occasional PVC Minimal LVH NS ST abnormality Pulmonary:     Effort: Pulmonary effort is normal.     Breath sounds: No stridor. No wheezing, rhonchi or rales.  Abdominal:     General: Abdomen is flat.     Palpations: There is no mass.     Tenderness: There is no abdominal tenderness. There is no guarding.     Hernia: No hernia is present.  Musculoskeletal:     Cervical back: Neck supple.      Right lower leg: No edema.     Left lower leg: No edema.  Feet:     Right foot:     Skin integrity: Skin integrity normal.     Toenail Condition: Right toenails are long.     Left foot:     Skin integrity: Skin integrity normal.     Toenail Condition: Left toenails are long.  Lymphadenopathy:     Cervical: No cervical adenopathy.  Skin:    General: Skin is warm and dry.  Neurological:     Mental Status: He is alert.     Motor: Weakness and atrophy present.     Gait: Gait abnormal.  Psychiatric:        Mood and Affect: Mood normal.        Behavior: Behavior normal.     Lab Results  Component Value Date   WBC 9.3 06/23/2023   HGB 12.9 (L) 06/23/2023   HCT 39.3 06/23/2023   PLT 316.0 06/23/2023   GLUCOSE 102 (H) 12/17/2022   CHOL 238 (H) 12/17/2022   TRIG 127.0 12/17/2022   HDL 71.50 12/17/2022   LDLCALC 141 (H) 12/17/2022   ALT 15 06/23/2023   AST 30 06/23/2023   NA 138 04/07/2023   K 4.0 04/07/2023   CL 95 (A) 04/07/2023   CREATININE 1.5 (A) 04/07/2023   BUN 10 04/07/2023   CO2 28 (A) 04/07/2023   TSH 1.85 06/23/2023   PSA 10 02/03/2023   INR 0.9 06/23/2023   HGBA1C 5.0 07/28/2019    CT CHEST LUNG CA SCREEN LOW DOSE W/O CM  Result Date: 08/11/2022 CLINICAL DATA:  67 year old male current smoker with 20 pack-year history of smoking. Lung cancer screening examination. EXAM: CT CHEST WITHOUT CONTRAST LOW-DOSE FOR LUNG CANCER SCREENING TECHNIQUE: Multidetector CT imaging of the chest was performed following the standard protocol without IV contrast. RADIATION DOSE REDUCTION: This exam was performed according to the departmental dose-optimization program which includes automated exposure control, adjustment of the mA and/or kV according to patient size and/or use of iterative reconstruction technique. COMPARISON:  No priors. FINDINGS: Cardiovascular: Heart size is normal. There is no significant pericardial fluid, thickening or pericardial calcification. There is aortic  atherosclerosis, as well as atherosclerosis of the great vessels of the mediastinum and the coronary arteries, including calcified atherosclerotic plaque in the left main and left circumflex coronary arteries. Mediastinum/Nodes: No pathologically enlarged mediastinal or hilar lymph nodes. Please note that accurate exclusion of hilar adenopathy is limited on noncontrast CT scans. Esophagus is unremarkable in appearance. No axillary lymphadenopathy. Lungs/Pleura: Tiny pulmonary nodule in the left lower lobe (axial image 263), with a volume derived mean diameter  of only 3.9 mm. No larger more suspicious appearing pulmonary nodules or masses are noted. No acute consolidative airspace disease. No pleural effusions. Mild diffuse bronchial wall thickening with mild centrilobular and paraseptal emphysema. Upper Abdomen: Aortic atherosclerosis. Musculoskeletal: There are no aggressive appearing lytic or blastic lesions noted in the visualized portions of the skeleton. IMPRESSION: 1. Lung-RADS 2S, benign appearance or behavior. Continue annual screening with low-dose chest CT without contrast in 12 months. 2. The "S" modifier above refers to potentially clinically significant non lung cancer related findings. Specifically, there is aortic atherosclerosis, in addition to left main and left circumflex coronary artery disease. Please note that although the presence of coronary artery calcium documents the presence of coronary artery disease, the severity of this disease and any potential stenosis cannot be assessed on this non-gated CT examination. Assessment for potential risk factor modification, dietary therapy or pharmacologic therapy may be warranted, if clinically indicated. 3. Mild diffuse bronchial wall thickening with mild centrilobular and paraseptal emphysema; imaging findings suggestive of underlying COPD. Aortic Atherosclerosis (ICD10-I70.0) and Emphysema (ICD10-J43.9). Electronically Signed   By: Trudie Reed  M.D.   On: 08/11/2022 10:44   Assessment & Plan:   Alcohol abuse- His MELD is 17. I have asked him to abstain from alcohol. -     Hepatic function panel; Future -     Protime-INR; Future -     CBC with Differential/Platelet; Future  Hyperlipidemia LDL goal <130- BP is adequately well controlled. -     TSH; Future -     Hepatic function panel; Future  Primary hypertension -     CBC with Differential/Platelet; Future -     EKG 12-Lead  DOE (dyspnea on exertion) -     Brain natriuretic peptide; Future -     Troponin I (High Sensitivity); Future -     ECHOCARDIOGRAM COMPLETE; Future  Abnormal electrocardiogram (ECG) (EKG) -     Brain natriuretic peptide; Future -     Troponin I (High Sensitivity); Future -     ECHOCARDIOGRAM COMPLETE; Future  Need for immunization against influenza -     Flu Vaccine Trivalent High Dose (Fluad)  Need for vaccination against Streptococcus pneumoniae using pneumococcal conjugate vaccine 13 -     Pneumococcal conjugate vaccine 20-valent  Vasomotor rhinitis -     Azelastine HCl; Place 1 spray into both nostrils 2 (two) times daily. Use in each nostril as directed  Dispense: 90 mL; Refill: 1     Follow-up: Return in about 3 months (around 09/23/2023).  Sanda Linger, MD

## 2023-07-21 ENCOUNTER — Other Ambulatory Visit: Payer: Self-pay | Admitting: Acute Care

## 2023-07-21 DIAGNOSIS — Z122 Encounter for screening for malignant neoplasm of respiratory organs: Secondary | ICD-10-CM

## 2023-07-21 DIAGNOSIS — Z87891 Personal history of nicotine dependence: Secondary | ICD-10-CM

## 2023-07-21 DIAGNOSIS — F17210 Nicotine dependence, cigarettes, uncomplicated: Secondary | ICD-10-CM

## 2023-08-12 ENCOUNTER — Inpatient Hospital Stay
Admission: RE | Admit: 2023-08-12 | Discharge: 2023-08-12 | Disposition: A | Discharge status: Home / Self Care | Payer: Medicare HMO | Source: Ambulatory Visit | Attending: Acute Care | Admitting: Acute Care

## 2023-08-12 DIAGNOSIS — Z122 Encounter for screening for malignant neoplasm of respiratory organs: Secondary | ICD-10-CM

## 2023-08-12 DIAGNOSIS — F17210 Nicotine dependence, cigarettes, uncomplicated: Secondary | ICD-10-CM | POA: Diagnosis not present

## 2023-08-12 DIAGNOSIS — Z87891 Personal history of nicotine dependence: Secondary | ICD-10-CM

## 2023-08-14 ENCOUNTER — Other Ambulatory Visit: Payer: Medicare HMO

## 2023-08-25 ENCOUNTER — Other Ambulatory Visit: Payer: Self-pay

## 2023-08-25 DIAGNOSIS — F17210 Nicotine dependence, cigarettes, uncomplicated: Secondary | ICD-10-CM

## 2023-08-25 DIAGNOSIS — Z87891 Personal history of nicotine dependence: Secondary | ICD-10-CM

## 2023-08-25 DIAGNOSIS — Z122 Encounter for screening for malignant neoplasm of respiratory organs: Secondary | ICD-10-CM

## 2023-08-26 ENCOUNTER — Other Ambulatory Visit: Payer: Self-pay | Admitting: Internal Medicine

## 2023-08-26 DIAGNOSIS — I10 Essential (primary) hypertension: Secondary | ICD-10-CM

## 2023-08-26 NOTE — Telephone Encounter (Signed)
 Copied from CRM [id number]. Topic: Clinical - Medication Refill >> Aug 26, 2023  3:50 PM Leotis ORN wrote: Most Recent Primary Care Visit:  Provider: JOSHUA DEBBY CROME  Department: [facility]  Visit Type: OFFICE VISIT  Date: 06/23/2023  Medication: amLODipine  (NORVASC ) 10 MG tablet  Has the patient contacted their pharmacy? Yes (Agent: If no, request that the patient contact the pharmacy for the refill. If patient does not wish to contact the pharmacy document the reason why and proceed with request.) (Agent: If yes, when and what did the pharmacy advise?)  Is this the correct pharmacy for this prescription? Yes If no, delete pharmacy and type the correct one.  This is the patient's preferred pharmacy:   [id number] [place], [place] - [address] [place] [address] Phone: [phone] Fax: [phone]   Has the prescription been filled recently? Yes  Is the patient out of the medication? Yes has enough until 08/29/23  Has the patient been seen for an appointment in the last year OR does the patient have an upcoming appointment? Yes  Can we respond through MyChart? Yes  Agent: Please be advised that Rx refills may take up to 3 business days. We ask that you follow-up with your pharmacy.

## 2023-08-28 MED ORDER — AMLODIPINE BESYLATE 10 MG PO TABS
10.0000 mg | ORAL_TABLET | Freq: Every day | ORAL | 1 refills | Status: DC
Start: 2023-08-28 — End: 2023-09-01

## 2023-09-01 ENCOUNTER — Ambulatory Visit: Payer: Medicare HMO | Admitting: Internal Medicine

## 2023-09-01 ENCOUNTER — Encounter: Payer: Self-pay | Admitting: Internal Medicine

## 2023-09-01 VITALS — BP 132/70 | HR 90 | Temp 98.1°F | Resp 16 | Ht 67.0 in | Wt 117.8 lb

## 2023-09-01 DIAGNOSIS — D531 Other megaloblastic anemias, not elsewhere classified: Secondary | ICD-10-CM | POA: Insufficient documentation

## 2023-09-01 DIAGNOSIS — E785 Hyperlipidemia, unspecified: Secondary | ICD-10-CM

## 2023-09-01 DIAGNOSIS — D539 Nutritional anemia, unspecified: Secondary | ICD-10-CM | POA: Diagnosis not present

## 2023-09-01 DIAGNOSIS — N1832 Chronic kidney disease, stage 3b: Secondary | ICD-10-CM | POA: Diagnosis not present

## 2023-09-01 DIAGNOSIS — R972 Elevated prostate specific antigen [PSA]: Secondary | ICD-10-CM | POA: Diagnosis not present

## 2023-09-01 DIAGNOSIS — D511 Vitamin B12 deficiency anemia due to selective vitamin B12 malabsorption with proteinuria: Secondary | ICD-10-CM | POA: Diagnosis not present

## 2023-09-01 DIAGNOSIS — R801 Persistent proteinuria, unspecified: Secondary | ICD-10-CM | POA: Diagnosis not present

## 2023-09-01 DIAGNOSIS — I10 Essential (primary) hypertension: Secondary | ICD-10-CM

## 2023-09-01 LAB — URINALYSIS, ROUTINE W REFLEX MICROSCOPIC
Bilirubin Urine: NEGATIVE
Hgb urine dipstick: NEGATIVE
Leukocytes,Ua: NEGATIVE
Nitrite: NEGATIVE
Specific Gravity, Urine: 1.025 (ref 1.000–1.030)
Total Protein, Urine: >=300 — AB
Urine Glucose: NEGATIVE
Urobilinogen, UA: 0.2 (ref 0.0–1.0)
pH: 6 (ref 5.0–8.0)

## 2023-09-01 LAB — CBC WITH DIFFERENTIAL/PLATELET
Basophils Absolute: 0.1 10*3/uL (ref 0.0–0.1)
Basophils Relative: 0.8 % (ref 0.0–3.0)
Eosinophils Absolute: 0.1 10*3/uL (ref 0.0–0.7)
Eosinophils Relative: 0.6 % (ref 0.0–5.0)
HCT: 41.9 % (ref 39.0–52.0)
Hemoglobin: 14.1 g/dL (ref 13.0–17.0)
Lymphocytes Relative: 26.1 % (ref 12.0–46.0)
Lymphs Abs: 2.6 10*3/uL (ref 0.7–4.0)
MCHC: 33.7 g/dL (ref 30.0–36.0)
MCV: 99.8 fL (ref 78.0–100.0)
Monocytes Absolute: 1 10*3/uL (ref 0.1–1.0)
Monocytes Relative: 10.2 % (ref 3.0–12.0)
Neutro Abs: 6.2 10*3/uL (ref 1.4–7.7)
Neutrophils Relative %: 62.3 % (ref 43.0–77.0)
Platelets: 301 10*3/uL (ref 150.0–400.0)
RBC: 4.2 Mil/uL — ABNORMAL LOW (ref 4.22–5.81)
RDW: 17.2 % — ABNORMAL HIGH (ref 11.5–15.5)
WBC: 9.9 10*3/uL (ref 4.0–10.5)

## 2023-09-01 LAB — BASIC METABOLIC PANEL
BUN: 8 mg/dL (ref 6–23)
CO2: 27 meq/L (ref 19–32)
Calcium: 10.1 mg/dL (ref 8.4–10.5)
Chloride: 97 meq/L (ref 96–112)
Creatinine, Ser: 1.51 mg/dL — ABNORMAL HIGH (ref 0.40–1.50)
GFR: 48.2 mL/min — ABNORMAL LOW (ref >60.00)
Glucose, Bld: 101 mg/dL — ABNORMAL HIGH (ref 70–99)
Potassium: 4.4 meq/L (ref 3.5–5.1)
Sodium: 138 meq/L (ref 135–145)

## 2023-09-01 LAB — FOLATE: Folate: 5.2 ng/mL — ABNORMAL LOW (ref >5.9)

## 2023-09-01 LAB — PSA: PSA: 13.95 ng/mL — ABNORMAL HIGH (ref 0.10–4.00)

## 2023-09-01 LAB — IBC + FERRITIN
Ferritin: 151.8 ng/mL (ref 22.0–322.0)
Iron: 61 ug/dL (ref 42–165)
Saturation Ratios: 17.9 % — ABNORMAL LOW (ref 20.0–50.0)
TIBC: 341.6 ug/dL (ref 250.0–450.0)
Transferrin: 244 mg/dL (ref 212.0–360.0)

## 2023-09-01 LAB — VITAMIN B12: Vitamin B-12: 135 pg/mL — ABNORMAL LOW (ref 211–911)

## 2023-09-01 MED ORDER — ROSUVASTATIN CALCIUM 10 MG PO TABS: 10.0000 mg | ORAL_TABLET | Freq: Every day | ORAL | 0 refills | Status: DC

## 2023-09-01 MED ORDER — FOLIC ACID 1 MG PO TABS: 1.0000 mg | ORAL_TABLET | Freq: Every day | ORAL | 1 refills | Status: DC

## 2023-09-01 MED ORDER — AMLODIPINE BESYLATE 10 MG PO TABS: 10.0000 mg | ORAL_TABLET | Freq: Every day | ORAL | 0 refills | Status: DC

## 2023-09-01 NOTE — Telephone Encounter (Signed)
 Copied from CRM [id number]. Topic: Clinical - Prescription Issue >> Aug 26, 2023  3:52 PM Leotis ORN wrote: EPIC NOT ROUTING RX PROPERLY: pt would like all Rx [facility]    Subject George Austin, George Austin (Patient)   Topic Clinical - Medication Refill  Communication Most Recent Primary Care Visit:  Provider: JOSHUA DEBBY CROME   Department: [facility]   Visit Type: OFFICE VISIT   Date: 06/23/2023    Medication: amLODipine  (NORVASC ) 10 MG tablet    Has the patient contacted their pharmacy? Yes  (Agent: If no, request that the patient contact the pharmacy for the refill. If patient does not wish to contact the pharmacy document the reason why and proceed with request.)  (Agent: If yes, when and what did the pharmacy advise?)    Is this the correct pharmacy for this prescription? Yes  If no, delete pharmacy and type the correct one.  This is the patient's preferred pharmacy:   [id number] [place], [place] - [address]  [address] [place]  [place] [address]  Phone: [phone] Fax: [phone]      Has the prescription been filled recently? Yes    Is the patient out of the medication? Yes has enough until 08/29/23    Has the patient been seen for an appointment in the last year OR does the patient have an upcoming appointment? Yes    Can we respond through MyChart? Yes    Agent: Please be advised that Rx refills may take up to 3 business days. We ask that you follow-up with your pharmacy.

## 2023-09-01 NOTE — Patient Instructions (Signed)

## 2023-09-01 NOTE — Progress Notes (Signed)
 Subjective:  Patient ID: George Austin, male    DOB: 06-19-1956  Age: 68 y.o. MRN: [id number]  CC: Hypertension   HPI George Austin presents for f/up ------  Discussed the use of AI scribe software for clinical note transcription with the patient, who gave verbal consent to proceed.  History of Present Illness   The patient, with a history of alcohol consumption, hypertension, and a raised PSA level, denies experiencing chest pain, shortness of breath, dizziness, or lightheadedness. He admits to continued alcohol consumption, specifically scotch, which is consumed in small quantities, particularly when he has no plans and is at home. He denies any symptoms such as chest pain, shortness of breath, dizziness, or lightheadedness during physical activity and has plans to join a local gym to increase his activity level. He denies any leg or feet swelling.  The patient confirms adherence to his prescribed medication, amlodipine , which was recently refilled. He also mentions a prescription for a nasal spray. His blood pressure reading during the visit was 132/70.  The patient has seen a urologist in the past due to a raised PSA level and has another appointment scheduled. He also mentions a CT scan for his stomach and a scheduled lung cancer screening, prompted by his smoking habit. He denies any breathing difficulties.  The patient denies any issues with urination.       Outpatient Medications Prior to Visit  Medication Sig Dispense Refill   azelastine  (ASTELIN ) 0.1 % nasal spray Place 1 spray into both nostrils 2 (two) times daily. Use in each nostril as directed 90 mL 1   Cholecalciferol 25 MCG (1000 UT) tablet Take by mouth.     ketoconazole  (NIZORAL ) 2 % shampoo Apply 1 Application topically 2 (two) times a week. 120 mL 0   Magnesium Oxide 250 MG TABS Take by mouth.     mometasone  (NASONEX ) 50 MCG/ACT nasal spray Place 2 sprays into the nose daily. (Patient taking differently: Place 2  sprays into the nose daily. As needed) 17 g 12   thiamine  (VITAMIN B1) 100 MG tablet Take 1 tablet (100 mg total) by mouth daily. 90 tablet 1   amLODipine  (NORVASC ) 10 MG tablet Take 1 tablet (10 mg total) by mouth daily. 90 tablet 1   rosuvastatin  (CRESTOR ) 10 MG tablet Take 1 tablet (10 mg total) by mouth daily. 90 tablet 1   No facility-administered medications prior to visit.    ROS Review of Systems  Constitutional:  Negative for appetite change, chills, diaphoresis, fatigue and unexpected weight change.  HENT: Negative.    Eyes:  Negative for visual disturbance.  Respiratory: Negative.  Negative for cough, chest tightness, shortness of breath and wheezing.   Cardiovascular:  Negative for chest pain, palpitations and leg swelling.  Gastrointestinal:  Negative for abdominal pain, constipation, diarrhea, nausea and vomiting.  Genitourinary: Negative.  Negative for difficulty urinating.  Musculoskeletal: Negative.  Negative for arthralgias and myalgias.  Skin: Negative.   Neurological:  Positive for weakness. Negative for dizziness and numbness.  Psychiatric/Behavioral:  Positive for confusion and decreased concentration.     Objective:  BP 132/70 (BP Location: Left Arm, Patient Position: Sitting, Cuff Size: Small)   Pulse 90   Temp 98.1 F (36.7 C) (Oral)   Resp 16   Ht 5' 7 (1.702 m)   Wt 117 lb 12.8 oz (53.4 kg)   SpO2 99%   BMI 18.45 kg/m   BP Readings from Last 3 Encounters:  09/01/23 132/70  06/23/23  138/82  02/17/23 123/80    Wt Readings from Last 3 Encounters:  09/01/23 117 lb 12.8 oz (53.4 kg)  06/23/23 116 lb (52.6 kg)  02/17/23 125 lb (56.7 kg)    Physical Exam Vitals reviewed.  Constitutional:      General: He is not in acute distress.    Appearance: Normal appearance. He is ill-appearing. He is not toxic-appearing or diaphoretic.  HENT:     Mouth/Throat:     Mouth: Mucous membranes are moist.  Eyes:     General: No scleral icterus.     Conjunctiva/sclera: Conjunctivae normal.  Cardiovascular:     Rate and Rhythm: Normal rate and regular rhythm.     Heart sounds: No murmur heard.    No friction rub. No gallop.  Pulmonary:     Effort: Pulmonary effort is normal.     Breath sounds: No stridor. No wheezing, rhonchi or rales.  Abdominal:     General: Abdomen is flat.     Palpations: There is no mass.     Tenderness: There is no abdominal tenderness. There is no guarding.     Hernia: No hernia is present.  Musculoskeletal:        General: Normal range of motion.     Cervical back: Neck supple.     Right lower leg: No edema.     Left lower leg: No edema.  Lymphadenopathy:     Cervical: No cervical adenopathy.  Skin:    General: Skin is dry.     Findings: No rash.  Neurological:     General: No focal deficit present.     Mental Status: He is alert. Mental status is at baseline.     Motor: Atrophy present.     Coordination: Coordination is intact.  Psychiatric:        Mood and Affect: Mood normal.        Behavior: Behavior normal.     Lab Results  Component Value Date   WBC 9.9 09/01/2023   HGB 14.1 09/01/2023   HCT 41.9 09/01/2023   PLT 301.0 09/01/2023   GLUCOSE 101 (H) 09/01/2023   CHOL 238 (H) 12/17/2022   TRIG 127.0 12/17/2022   HDL 71.50 12/17/2022   LDLCALC 141 (H) 12/17/2022   ALT 15 06/23/2023   AST 30 06/23/2023   NA 138 09/01/2023   K 4.4 09/01/2023   CL 97 09/01/2023   CREATININE 1.51 (H) 09/01/2023   BUN 8 09/01/2023   CO2 27 09/01/2023   TSH 1.85 06/23/2023   PSA 13.95 (H) 09/01/2023   INR 0.9 06/23/2023   HGBA1C 5.0 07/28/2019    CT CHEST LUNG CA SCREEN LOW DOSE W/O CM Result Date: 08/24/2023 CLINICAL DATA:  68 year old male current smoker with 21 pack-year history of smoking. Lung cancer screening examination. EXAM: CT CHEST WITHOUT CONTRAST LOW-DOSE FOR LUNG CANCER SCREENING TECHNIQUE: Multidetector CT imaging of the chest was performed following the standard protocol without IV  contrast. RADIATION DOSE REDUCTION: This exam was performed according to the departmental dose-optimization program which includes automated exposure control, adjustment of the mA and/or kV according to patient size and/or use of iterative reconstruction technique. COMPARISON:  Low-dose lung cancer screening chest CT 08/09/2022. FINDINGS: Cardiovascular: Heart size is normal. There is no significant pericardial fluid, thickening or pericardial calcification. There is aortic atherosclerosis, as well as atherosclerosis of the great vessels of the mediastinum and the coronary arteries, including calcified atherosclerotic plaque in the left main coronary artery. Mediastinum/Nodes: No pathologically enlarged  mediastinal or hilar lymph nodes. Please note that accurate exclusion of hilar adenopathy is limited on noncontrast CT scans. Esophagus is unremarkable in appearance. No axillary lymphadenopathy. Lungs/Pleura: Small pulmonary nodules are noted in the lungs, largest of which is in the inferior segment of the lingula abutting the major fissure (axial image 246), with a volume derived mean diameter of 3.7 mm. No other larger more suspicious appearing pulmonary nodules or masses are noted. No acute consolidative airspace disease. No pleural effusions. Mild diffuse bronchial wall thickening with mild centrilobular and paraseptal emphysema. Upper Abdomen: Aortic atherosclerosis. Musculoskeletal: There are no aggressive appearing lytic or blastic lesions noted in the visualized portions of the skeleton. IMPRESSION: 1. Lung-RADS 2S, benign appearance or behavior. Continue annual screening with low-dose chest CT without contrast in 12 months. 2. The S modifier above refers to potentially clinically significant non lung cancer related findings. Specifically, there is aortic atherosclerosis, in addition to left main coronary artery disease. Please note that although the presence of coronary artery calcium  documents the  presence of coronary artery disease, the severity of this disease and any potential stenosis cannot be assessed on this non-gated CT examination. Assessment for potential risk factor modification, dietary therapy or pharmacologic therapy may be warranted, if clinically indicated. 3. Mild diffuse bronchial wall thickening with mild centrilobular and paraseptal emphysema; imaging findings suggestive of underlying COPD. Aortic Atherosclerosis (ICD10-I70.0) and Emphysema (ICD10-J43.9). Electronically Signed   By: Toribio Aye M.D.   On: 08/24/2023 12:33    Assessment & Plan:  Stage 3b chronic kidney disease (HCC) -     Urinalysis, Routine w reflex microscopic; Future -     Basic metabolic panel; Future -     Ambulatory referral to Nephrology  Primary hypertension- BP is well controlled. -     Urinalysis, Routine w reflex microscopic; Future -     Basic metabolic panel; Future -     amLODIPine  Besylate; Take 1 tablet (10 mg total) by mouth daily.  Dispense: 90 tablet; Refill: 0  PSA elevation -     PSA; Future -     Ambulatory referral to Urology  Deficiency anemia -     IBC + Ferritin; Future -     Reticulocytes; Future -     Vitamin B1; Future -     Zinc ; Future -     Vitamin B12; Future -     CBC with Differential/Platelet; Future -     Folate; Future  Hyperlipidemia LDL goal <130 -     Rosuvastatin  Calcium ; Take 1 tablet (10 mg total) by mouth daily.  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  Combined B12 and folate deficiency anemia -     Folic Acid ; Take 1 tablet (1 mg total) by mouth daily.  Dispense: 90 tablet; Refill: 1  Persistent proteinuria -     Ambulatory referral to Nephrology     Follow-up: Return in about 3 months (around 11/30/2023).  Debby Molt, MD

## 2023-09-02 ENCOUNTER — Telehealth: Payer: Self-pay

## 2023-09-02 NOTE — Telephone Encounter (Signed)
 Copied from CRM [id number]. Topic: Clinical - Request for Lab/Test Order >> Sep 02, 2023  2:00 PM Gurney Maxin H wrote:  Reason for CRM: Patient calling in to schedule a b12 injection please reach out to patient. Thank you  Jaimes[place] [phone]

## 2023-09-05 ENCOUNTER — Other Ambulatory Visit: Payer: Self-pay | Admitting: Internal Medicine

## 2023-09-05 DIAGNOSIS — F1010 Alcohol abuse, uncomplicated: Secondary | ICD-10-CM

## 2023-09-05 DIAGNOSIS — E5111 Dry beriberi: Secondary | ICD-10-CM

## 2023-09-05 LAB — RETICULOCYTES
ABS Retic: 80370 {cells}/uL (ref 25000–90000)
Retic Ct Pct: 1.9 %

## 2023-09-05 LAB — VITAMIN B1: Vitamin B1 (Thiamine): <6 nmol/L — ABNORMAL LOW (ref 8–30)

## 2023-09-05 LAB — ZINC: Zinc: 68 ug/dL (ref 60–130)

## 2023-09-05 MED ORDER — THIAMINE HCL 100 MG PO TABS: 100.0000 mg | ORAL_TABLET | Freq: Every day | ORAL | 1 refills | Status: DC

## 2023-09-08 ENCOUNTER — Ambulatory Visit (INDEPENDENT_AMBULATORY_CARE_PROVIDER_SITE_OTHER): Payer: Medicare HMO

## 2023-09-08 DIAGNOSIS — E538 Deficiency of other specified B group vitamins: Secondary | ICD-10-CM | POA: Diagnosis not present

## 2023-09-08 MED ORDER — CYANOCOBALAMIN 1000 MCG/ML IJ SOLN
1000.0000 ug | Freq: Once | INTRAMUSCULAR | Status: AC
  Administered 2023-09-08: 1000 ug via INTRAMUSCULAR

## 2023-09-08 NOTE — Progress Notes (Signed)
 After obtaining consent, and per orders of Dr. Yetta Barre, injection of B12 given by Ferdie Ping. Patient instructed to report any adverse reaction to me immediately.

## 2023-09-11 ENCOUNTER — Ambulatory Visit (INDEPENDENT_AMBULATORY_CARE_PROVIDER_SITE_OTHER): Payer: Medicare HMO

## 2023-09-11 DIAGNOSIS — R9431 Abnormal electrocardiogram [ECG] [EKG]: Secondary | ICD-10-CM | POA: Diagnosis not present

## 2023-09-11 DIAGNOSIS — R0609 Other forms of dyspnea: Secondary | ICD-10-CM | POA: Diagnosis not present

## 2023-09-11 LAB — ECHOCARDIOGRAM COMPLETE
Area-P 1/2: 4.49 cm2
S' Lateral: 1.67 cm

## 2023-09-12 ENCOUNTER — Other Ambulatory Visit: Payer: Self-pay | Admitting: Internal Medicine

## 2023-09-12 DIAGNOSIS — L219 Seborrheic dermatitis, unspecified: Secondary | ICD-10-CM

## 2023-09-12 NOTE — Telephone Encounter (Signed)
Copied from CRM [id number]. Topic: Clinical - Medication Refill >> Sep 12, 2023 10:19 AM Corin V wrote: Most Recent Primary Care Visit:  Provider: Cathleen Fears, GRACE P  Department: [facility]  Visit Type: NURSE VISIT  Date: 09/08/2023  Medication: ***  Has the patient contacted their pharmacy?  (Agent: If no, request that the patient contact the pharmacy for the refill. If patient does not wish to contact the pharmacy document the reason why and proceed with request.) (Agent: If yes, when and what did the pharmacy advise?)  Is this the correct pharmacy for this prescription?  If no, delete pharmacy and type the correct one.  This is the patient's preferred pharmacy:  [facility] - [place], [place] - [address] [place] [address] Phone: [phone] Fax: [phone]  [id number] [place], [place] - [address] [place] [address] Phone: [phone] Fax: [phone]   Has the prescription been filled recently?   Is the patient out of the medication?   Has the patient been seen for an appointment in the last year OR does the patient have an upcoming appointment?   Can we respond through MyChart?   Agent: Please be advised that Rx refills may take up to 3 business days. We ask that you follow-up with your pharmacy.

## 2023-09-16 ENCOUNTER — Telehealth: Payer: Self-pay | Admitting: Internal Medicine

## 2023-09-16 ENCOUNTER — Other Ambulatory Visit: Payer: Self-pay | Admitting: Internal Medicine

## 2023-09-16 DIAGNOSIS — L219 Seborrheic dermatitis, unspecified: Secondary | ICD-10-CM

## 2023-09-16 MED ORDER — KETOCONAZOLE 2 % EX SHAM: 1.0000 | MEDICATED_SHAMPOO | CUTANEOUS | 0 refills | Status: AC

## 2023-09-16 NOTE — Telephone Encounter (Signed)
 Copied from CRM [id number]. Topic: Clinical - Medication Question >> Sep 16, 2023  2:23 PM Almira Coaster wrote: Reason for CRM: Patient is calling to follow up on his refill request for ketoconazole (NIZORAL) 2 % shampoo. He called in on 09/12/2023 and has not heard back. He contacted the pharmacy and they have not received the prescription.

## 2023-10-19 IMAGING — MR MR ABDOMEN WO/W CM
16 of 17 series · 45 of 48 positions shown · IV contrast (GADAVIST)
Comparison: Renal ultrasound 01/16/2022

CLINICAL DATA: Left renal mass

EXAM:
MRI ABDOMEN WITHOUT AND WITH CONTRAST
TECHNIQUE: Multiplanar multisequence MR imaging of the abdomen was performed
both before and after the administration of intravenous contrast.
CONTRAST:  5mL GADAVIST GADOBUTROL 1 MMOL/ML IV SOLN

[Series 3: cor ssfse / · coronal · 7.0mm · 1.48mm/px · 1 of 27 slices shown]
[im 1/27]
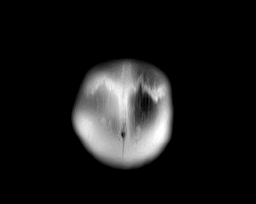

[Series 5: T1 · axial · 7.0mm · 0.66mm/px · z∈[-149,+69]mm · 3 of 54 slices shown]
[im 1/54]
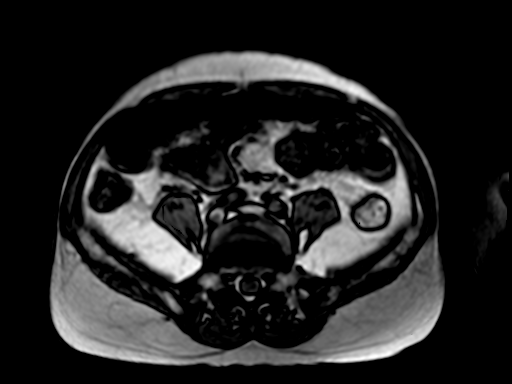
[im 27/54]
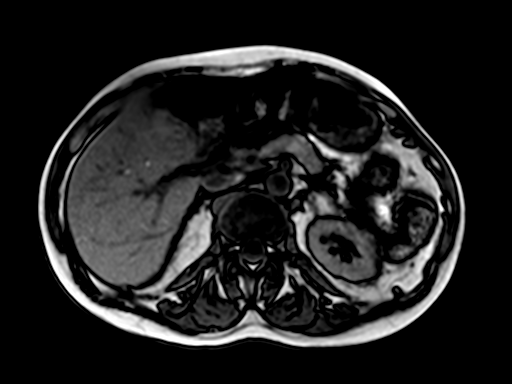
[im 54/54]
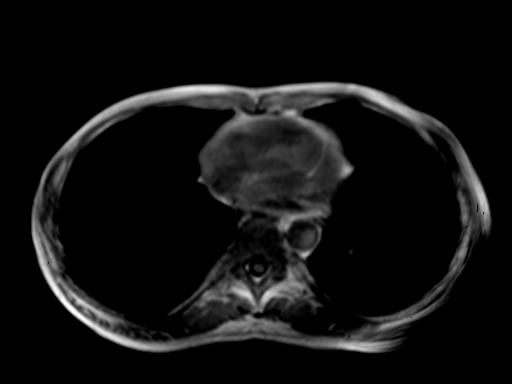

[Series 6: bSSFP · axial · 7.0mm · 0.74mm/px · z∈[-149,+69]mm · 2 of 27 slices shown]
[im 1/27]
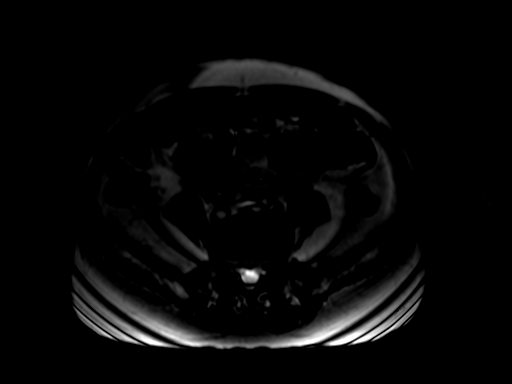
[im 27/27]
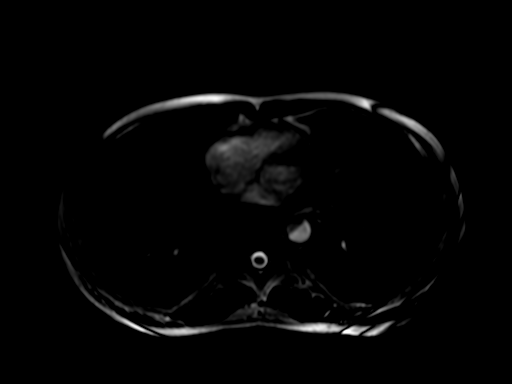

[Series 7: DWI · axial · 7.0mm · 1.77mm/px · z∈[-196,+115]mm · 6 of 114 slices shown]
[im 1/114]
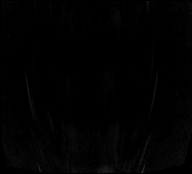
[im 23/114]
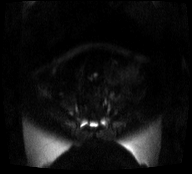
[im 46/114]
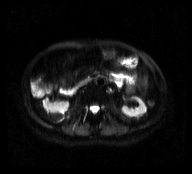
[im 68/114]
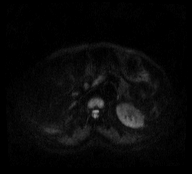
[im 91/114]
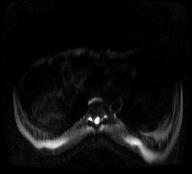
[im 114/114]
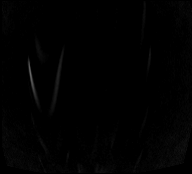

[Series 8: axial dwi_adc · axial · 7.0mm · 1.77mm/px · z∈[-196,+115]mm · 2 of 38 slices shown]
[im 1/38]
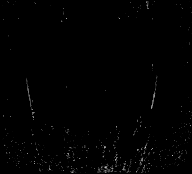
[im 38/38]
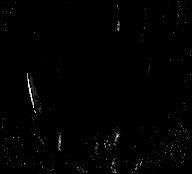

[Series 9: axial ssfse / · axial · 7.0mm · 1.06mm/px · z∈[-149,+69]mm · 2 of 27 slices shown]
[im 1/27]
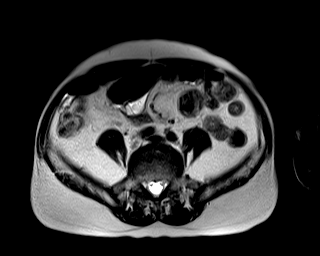
[im 27/27]
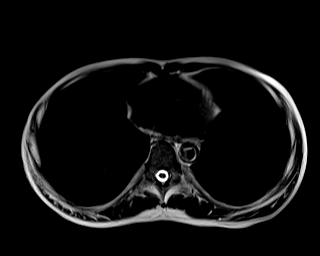

[Series 10: axial dynamic pre · axial · non-contrast · 4.0mm · 1.33mm/px · z∈[-142,+62]mm · 3 of 52 slices shown]
[im 1/52]
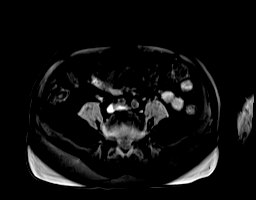
[im 26/52]
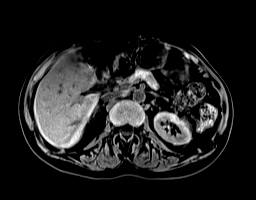
[im 52/52]
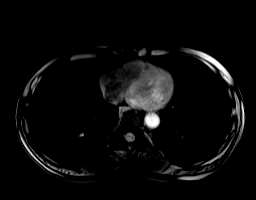

[Series 11: T2 fat-sat · axial · 7.0mm · 1.33mm/px · z∈[-149,+69]mm · 2 of 27 slices shown]
[im 1/27]
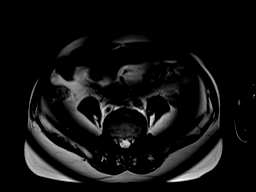
[im 27/27]
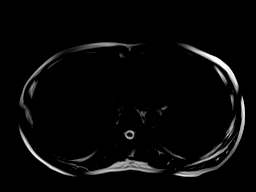

[Series 12: axial dynamic post · axial · 4.0mm · 1.33mm/px · z∈[-142,+62]mm · 3 of 52 slices shown (1 of 6)]
[im 1/52]
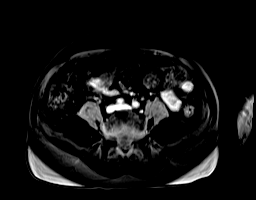
[im 26/52]
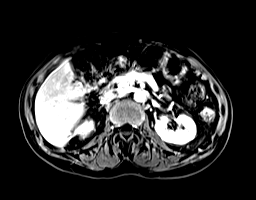
[im 52/52]
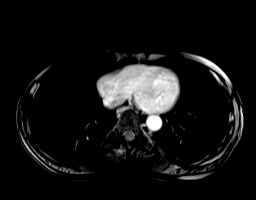

[Series 13: axial dynamic post · axial · 4.0mm · 1.33mm/px · z∈[-142,+62]mm · 3 of 52 slices shown (2 of 6)]
[im 1/52]
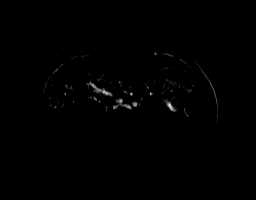
[im 26/52]
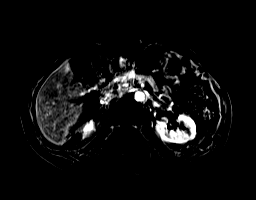
[im 52/52]
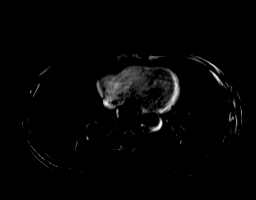

[Series 14: axial dynamic post · axial · 4.0mm · 1.33mm/px · z∈[-142,+62]mm · 3 of 52 slices shown (3 of 6)]
[im 1/52]
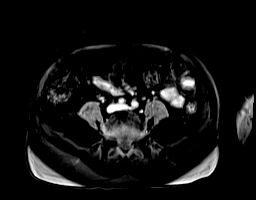
[im 26/52]
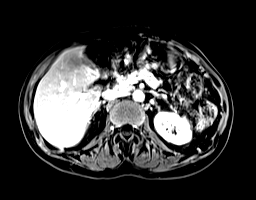
[im 52/52]
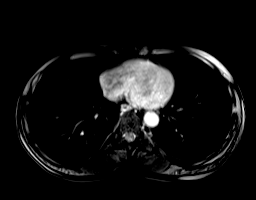

[Series 15: axial dynamic post · axial · 4.0mm · 1.33mm/px · z∈[-142,+62]mm · 3 of 52 slices shown (4 of 6)]
[im 1/52]
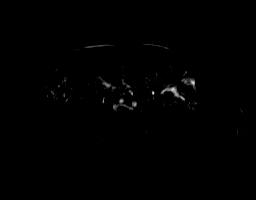
[im 26/52]
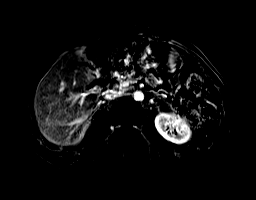
[im 52/52]
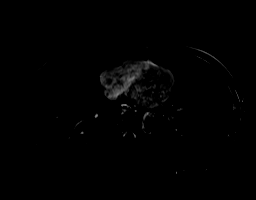

[Series 16: axial dynamic post · axial · 4.0mm · 1.33mm/px · z∈[-142,+62]mm · 3 of 52 slices shown (5 of 6)]
[im 1/52]
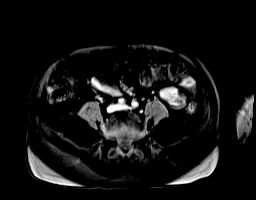
[im 26/52]
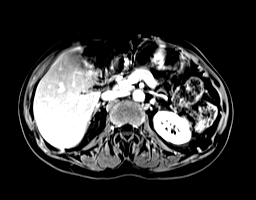
[im 52/52]
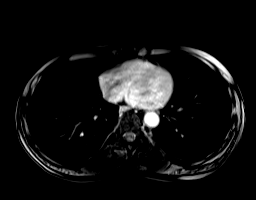

[Series 17: axial dynamic post · axial · 4.0mm · 1.33mm/px · z∈[-142,+62]mm · 3 of 52 slices shown (6 of 6)]
[im 1/52]
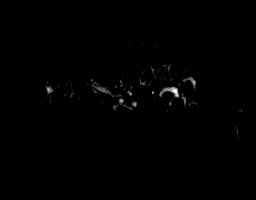
[im 26/52]
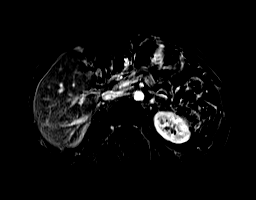
[im 52/52]
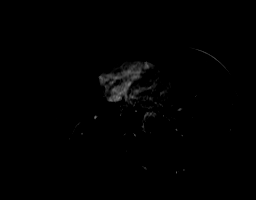

[Series 19: axial dynamic 3 · axial · 4.0mm · 1.33mm/px · z∈[-142,+62]mm · 3 of 52 slices shown (1 of 2)]
[im 1/52]
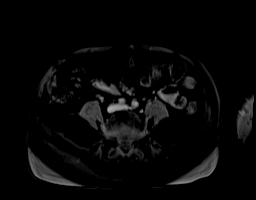
[im 26/52]
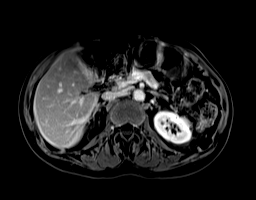
[im 52/52]
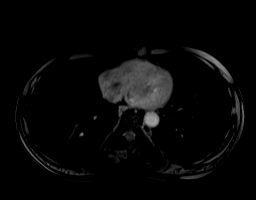

[Series 20: axial dynamic 3 · axial · 4.0mm · 1.33mm/px · z∈[-142,+62]mm · 3 of 52 slices shown (2 of 2)]
[im 1/52]
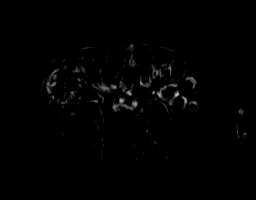
[im 26/52]
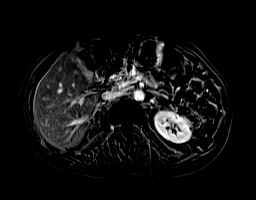
[im 52/52]
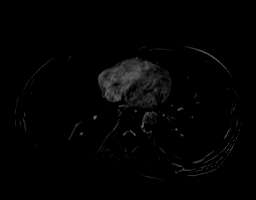

[45 of 48 positions shown; findings below may reference images not displayed]

FINDINGS: Study is limited due to motion.

Lower chest: No acute findings.

Hepatobiliary: Liver is normal in size and contour with no
suspicious mass identified. Gallbladder appears grossly normal. No
biliary ductal dilatation identified.

Pancreas: No mass, inflammatory changes, or other parenchymal
abnormality identified.

Spleen:  Small/atrophic with no focal mass identified.

Adrenals/Urinary Tract: Adrenal glands appear unremarkable. Multiple
renal cortical cysts identified measuring up to 1.5 cm in the right
kidney and numerous subcentimeter in size. No suspicious renal mass
identified. No hydronephrosis.

Stomach/Bowel: Visualized portions within the abdomen are
unremarkable.

Vascular/Lymphatic: No pathologically enlarged lymph nodes
identified. No abdominal aortic aneurysm demonstrated.

Other:  No ascites.

Musculoskeletal: No suspicious bone lesions identified.
IMPRESSION: 1. No suspicious renal mass identified.  Multiple renal cysts.
2. Small spleen noted.
3. Limited study due to motion.

## 2023-10-22 DIAGNOSIS — R972 Elevated prostate specific antigen [PSA]: Secondary | ICD-10-CM | POA: Diagnosis not present

## 2023-10-22 DIAGNOSIS — N400 Enlarged prostate without lower urinary tract symptoms: Secondary | ICD-10-CM | POA: Diagnosis not present

## 2023-10-24 DIAGNOSIS — N1831 Chronic kidney disease, stage 3a: Secondary | ICD-10-CM | POA: Diagnosis not present

## 2023-10-27 ENCOUNTER — Other Ambulatory Visit: Payer: Self-pay | Admitting: Urology

## 2023-10-27 DIAGNOSIS — R972 Elevated prostate specific antigen [PSA]: Secondary | ICD-10-CM

## 2023-10-30 DIAGNOSIS — I129 Hypertensive chronic kidney disease with stage 1 through stage 4 chronic kidney disease, or unspecified chronic kidney disease: Secondary | ICD-10-CM | POA: Diagnosis not present

## 2023-10-30 DIAGNOSIS — N1831 Chronic kidney disease, stage 3a: Secondary | ICD-10-CM | POA: Diagnosis not present

## 2023-10-30 DIAGNOSIS — E785 Hyperlipidemia, unspecified: Secondary | ICD-10-CM | POA: Diagnosis not present

## 2023-10-30 DIAGNOSIS — E46 Unspecified protein-calorie malnutrition: Secondary | ICD-10-CM | POA: Diagnosis not present

## 2023-11-28 ENCOUNTER — Other Ambulatory Visit: Payer: Self-pay | Admitting: Internal Medicine

## 2023-11-28 DIAGNOSIS — E785 Hyperlipidemia, unspecified: Secondary | ICD-10-CM

## 2023-12-08 DIAGNOSIS — N1831 Chronic kidney disease, stage 3a: Secondary | ICD-10-CM | POA: Diagnosis not present

## 2023-12-11 ENCOUNTER — Ambulatory Visit
Admission: RE | Admit: 2023-12-11 | Discharge: 2023-12-11 | Disposition: A | Discharge status: Home / Self Care | Source: Ambulatory Visit | Attending: Urology | Admitting: Urology

## 2023-12-11 DIAGNOSIS — R972 Elevated prostate specific antigen [PSA]: Secondary | ICD-10-CM | POA: Diagnosis not present

## 2023-12-11 MED ORDER — GADOPICLENOL 0.5 MMOL/ML IV SOLN
7.0000 mL | Freq: Once | INTRAVENOUS | Status: AC | PRN
  Administered 2023-12-11: 7 mL via INTRAVENOUS

## 2023-12-24 DIAGNOSIS — R972 Elevated prostate specific antigen [PSA]: Secondary | ICD-10-CM | POA: Diagnosis not present

## 2024-01-07 DIAGNOSIS — C61 Malignant neoplasm of prostate: Secondary | ICD-10-CM | POA: Diagnosis not present

## 2024-01-16 ENCOUNTER — Telehealth: Payer: Self-pay

## 2024-01-16 NOTE — Telephone Encounter (Signed)
 This patient is appearing on a report for being at risk of failing the adherence measure for cholesterol (statin) medications this calendar year.   Medication: rosuvastatin  10 mg Last fill date: 09/01/23 for 90 day supply  Spoke with patient who stated he still has about a 30 day supply left. Notes that he takes the medication at bedtime and will accidentally skip a dose if he falls asleep early. Provided education that patient could take medication in the morning if that would help improve adherence. Patient voiced understanding and is aware [facility]  Will send a reminder to embedded pharmacist to call patient to check in on home supply closer to adherence measure fail date of 02/10/24.  Abelina Abide, PharmD PGY1 Pharmacy Resident 01/16/2024 3:22 PM

## 2024-01-20 DIAGNOSIS — C61 Malignant neoplasm of prostate: Secondary | ICD-10-CM | POA: Diagnosis not present

## 2024-01-26 ENCOUNTER — Other Ambulatory Visit (HOSPITAL_COMMUNITY): Payer: Self-pay | Admitting: Urology

## 2024-01-26 DIAGNOSIS — N1831 Chronic kidney disease, stage 3a: Secondary | ICD-10-CM | POA: Diagnosis not present

## 2024-01-26 DIAGNOSIS — C61 Malignant neoplasm of prostate: Secondary | ICD-10-CM

## 2024-01-27 ENCOUNTER — Other Ambulatory Visit: Payer: Self-pay | Admitting: Urology

## 2024-01-29 DIAGNOSIS — M6281 Muscle weakness (generalized): Secondary | ICD-10-CM | POA: Diagnosis not present

## 2024-01-29 DIAGNOSIS — C61 Malignant neoplasm of prostate: Secondary | ICD-10-CM | POA: Diagnosis not present

## 2024-01-30 DIAGNOSIS — N1831 Chronic kidney disease, stage 3a: Secondary | ICD-10-CM | POA: Diagnosis not present

## 2024-01-30 DIAGNOSIS — E46 Unspecified protein-calorie malnutrition: Secondary | ICD-10-CM | POA: Diagnosis not present

## 2024-01-30 DIAGNOSIS — C189 Malignant neoplasm of colon, unspecified: Secondary | ICD-10-CM | POA: Diagnosis not present

## 2024-01-30 DIAGNOSIS — I129 Hypertensive chronic kidney disease with stage 1 through stage 4 chronic kidney disease, or unspecified chronic kidney disease: Secondary | ICD-10-CM | POA: Diagnosis not present

## 2024-02-12 ENCOUNTER — Ambulatory Visit (HOSPITAL_COMMUNITY)
Admission: RE | Admit: 2024-02-12 | Discharge: 2024-02-12 | Disposition: A | Discharge status: Home / Self Care | Source: Ambulatory Visit | Attending: Urology | Admitting: Urology

## 2024-02-12 DIAGNOSIS — C61 Malignant neoplasm of prostate: Secondary | ICD-10-CM

## 2024-02-12 DIAGNOSIS — N281 Cyst of kidney, acquired: Secondary | ICD-10-CM | POA: Diagnosis not present

## 2024-02-12 DIAGNOSIS — N400 Enlarged prostate without lower urinary tract symptoms: Secondary | ICD-10-CM | POA: Diagnosis not present

## 2024-02-12 LAB — POCT I-STAT CREATININE: Creatinine, Ser: 1.9 mg/dL — ABNORMAL HIGH (ref 0.61–1.24)

## 2024-02-12 MED ORDER — SODIUM CHLORIDE (PF) 0.9 % IJ SOLN
INTRAMUSCULAR | Status: AC
  Filled 2024-02-12: qty 50

## 2024-02-12 MED ORDER — TECHNETIUM TC 99M MEDRONATE IV KIT
20.0000 | PACK | Freq: Once | INTRAVENOUS | Status: AC | PRN
  Administered 2024-02-12: 18.4 via INTRAVENOUS

## 2024-02-12 MED ORDER — IOHEXOL 300 MG/ML  SOLN
75.0000 mL | Freq: Once | INTRAMUSCULAR | Status: AC | PRN
Start: 2024-02-12 — End: 2024-02-12
  Administered 2024-02-12: 75 mL via INTRAVENOUS

## 2024-02-25 DIAGNOSIS — C61 Malignant neoplasm of prostate: Secondary | ICD-10-CM | POA: Diagnosis not present

## 2024-03-08 DIAGNOSIS — N393 Stress incontinence (female) (male): Secondary | ICD-10-CM | POA: Diagnosis not present

## 2024-03-08 DIAGNOSIS — M62838 Other muscle spasm: Secondary | ICD-10-CM | POA: Diagnosis not present

## 2024-03-08 DIAGNOSIS — M6281 Muscle weakness (generalized): Secondary | ICD-10-CM | POA: Diagnosis not present

## 2024-03-08 NOTE — Patient Instructions (Signed)
 SURGICAL WAITING ROOM VISITATION  Patients having surgery or a procedure may have no more than 2 support people in the waiting area - these visitors may rotate.    Children under the age of 31 must have an adult with them who is not the patient.  Visitors with respiratory illnesses are discouraged from visiting and should remain at home.  If the patient needs to stay at the hospital during part of their recovery, the visitor guidelines for inpatient rooms apply. Pre-op nurse will coordinate an appropriate time for 1 support person to accompany patient in pre-op.  This support person may not rotate.    Please refer to the [facility] website for the visitor guidelines for Inpatients (after your surgery is over and you are in a regular room).       Your procedure is scheduled on: 03/18/24   Report to [facility] Main Entrance    Report to admitting at 5:15 AM   Call this number if you have problems the morning of surgery [phone]   Do not eat food or drink liquids :After Midnight.but may have sips of water with meds.     FOLLOW BOWEL PREP AND ANY ADDITIONAL PRE OP INSTRUCTIONS YOU RECEIVED FROM YOUR SURGEON'S OFFICE!!!     Oral Hygiene is also important to reduce your risk of infection.                                    Remember - BRUSH YOUR TEETH THE MORNING OF SURGERY WITH YOUR REGULAR TOOTHPASTE  DENTURES WILL BE REMOVED PRIOR TO SURGERY PLEASE DO NOT APPLY Poly grip OR ADHESIVES!!!   Do NOT smoke after Midnight   Stop all vitamins and herbal supplements 7 days before surgery.   Take these medicines the morning of surgery with A SIP OF WATER: amlodipine , rosuvastatin , nasal spray             You may not have any metal on your body including hair pins, jewelry, and body piercing             Do not wear  lotions, powders,cologne, or deodorant              Men may shave face and neck.   Do not bring valuables to the hospital. [facility] IS NOT              RESPONSIBLE   FOR VALUABLES.   Contacts, glasses, dentures or bridgework may not be worn into surgery.   Bring small overnight bag day of surgery.   DO NOT BRING YOUR HOME MEDICATIONS TO THE HOSPITAL. PHARMACY WILL DISPENSE MEDICATIONS LISTED ON YOUR MEDICATION LIST TO YOU DURING YOUR ADMISSION IN THE HOSPITAL!    Patients discharged on the day of surgery will not be allowed to drive home.  Someone NEEDS to stay with you for the first 24 hours after anesthesia.   Special Instructions: Bring a copy of your healthcare power of attorney and living will documents the day of surgery if you haven't scanned them before.              Please read over the following fact sheets you were given: IF YOU HAVE QUESTIONS ABOUT YOUR PRE-OP INSTRUCTIONS PLEASE CALL [phone] [place]   If you received a COVID test during your pre-op visit  it is requested that you wear a mask when out in public, stay away from  anyone that may not be feeling well and notify your surgeon if you develop symptoms. If you test positive for Covid or have been in contact with anyone that has tested positive in the last 10 days please notify you surgeon.    [facility] - Preparing for Surgery Before surgery, you can play an important role.  Because skin is not sterile, your skin needs to be as free of germs as possible.  You can reduce the number of germs on your skin by washing with CHG (chlorahexidine gluconate) soap before surgery.  CHG is an antiseptic cleaner which kills germs and bonds with the skin to continue killing germs even after washing. Please DO NOT use if you have an allergy to CHG or antibacterial soaps.  If your skin becomes reddened/irritated stop using the CHG and inform your nurse when you arrive at Short Stay. Do not shave (including legs and underarms) for at least 48 hours prior to the first CHG shower.  You may shave your face/neck.  Please follow these instructions carefully:  1.  Shower with CHG Soap the  night before surgery and the  morning of surgery.  2.  If you choose to wash your hair, wash your hair first as usual with your normal  shampoo.  3.  After you shampoo, rinse your hair and body thoroughly to remove the shampoo.                             4.  Use CHG as you would any other liquid soap.  You can apply chg directly to the skin and wash.  Gently with a scrungie or clean washcloth.  5.  Apply the CHG Soap to your body ONLY FROM THE NECK DOWN.   Do   not use on face/ open                           Wound or open sores. Avoid contact with eyes, ears mouth and   genitals (private parts).                       Wash face,  Genitals (private parts) with your normal soap.             6.  Wash thoroughly, paying special attention to the area where your    surgery  will be performed.  7.  Thoroughly rinse your body with warm water from the neck down.  8.  DO NOT shower/wash with your normal soap after using and rinsing off the CHG Soap.                9.  Pat yourself dry with a clean towel.            10.  Wear clean pajamas.            11.  Place clean sheets on your bed the night of your first shower and do not  sleep with pets. Day of Surgery : Do not apply any lotions/deodorants the morning of surgery.  Please wear clean clothes to the hospital/surgery center.  FAILURE TO FOLLOW THESE INSTRUCTIONS MAY RESULT IN THE CANCELLATION OF YOUR SURGERY   WHAT IS A BLOOD TRANSFUSION? Blood Transfusion Information  A transfusion is the replacement of blood or some of its parts. Blood is made up of multiple cells which provide different functions. Red blood  cells carry oxygen and are used for blood loss replacement. White blood cells fight against infection. Platelets control bleeding. Plasma helps clot blood. Other blood products are available for specialized needs, such as hemophilia or other clotting disorders. BEFORE THE TRANSFUSION  Who gives blood for transfusions?  Healthy volunteers  who are fully evaluated to make sure their blood is safe. This is blood bank blood. Transfusion therapy is the safest it has ever been in the practice of medicine. Before blood is taken from a donor, a complete history is taken to make sure that person has no history of diseases nor engages in risky social behavior (examples are intravenous drug use or sexual activity with multiple partners). The donor's travel history is screened to minimize risk of transmitting infections, such as malaria. The donated blood is tested for signs of infectious diseases, such as HIV and hepatitis. The blood is then tested to be sure it is compatible with you in order to minimize the chance of a transfusion reaction. If you or a relative donates blood, this is often done in anticipation of surgery and is not appropriate for emergency situations. It takes many days to process the donated blood. RISKS AND COMPLICATIONS Although transfusion therapy is very safe and saves many lives, the main dangers of transfusion include:  Getting an infectious disease. Developing a transfusion reaction. This is an allergic reaction to something in the blood you were given. Every precaution is taken to prevent this. The decision to have a blood transfusion has been considered carefully by your caregiver before blood is given. Blood is not given unless the benefits outweigh the risks. AFTER THE TRANSFUSION Right after receiving a blood transfusion, you will usually feel much better and more energetic. This is especially true if your red blood cells have gotten low (anemic). The transfusion raises the level of the red blood cells which carry oxygen, and this usually causes an energy increase. The nurse administering the transfusion will monitor you carefully for complications. HOME CARE INSTRUCTIONS  No special instructions are needed after a transfusion. You may find your energy is better. Speak with your caregiver about any limitations on  activity for underlying diseases you may have. SEEK MEDICAL CARE IF:  Your condition is not improving after your transfusion. You develop redness or irritation at the intravenous (IV) site. SEEK IMMEDIATE MEDICAL CARE IF:  Any of the following symptoms occur over the next 12 hours: Shaking chills. You have a temperature by mouth above 102 F (38.9 C), not controlled by medicine. Chest, back, or muscle pain. People around you feel you are not acting correctly or are confused. Shortness of breath or difficulty breathing. Dizziness and fainting. You get a rash or develop hives. You have a decrease in urine output. Your urine turns a dark color or changes to pink, red, or brown. Any of the following symptoms occur over the next 10 days: You have a temperature by mouth above 102 F (38.9 C), not controlled by medicine. Shortness of breath. Weakness after normal activity. The white part of the eye turns yellow (jaundice). You have a decrease in the amount of urine or are urinating less often. Your urine turns a dark color or changes to pink, red, or brown. Document Released: 08/09/2000 Document Revised: 11/04/2011 Document Reviewed: 03/28/2008 [facility] Patient Information 2014 [place], [place].

## 2024-03-08 NOTE — Progress Notes (Signed)
 COVID Vaccine received:  []  No [x]  Yes Date of any COVID positive Test in last 90 days: no PCP - Dr. Debby Molt Cardiologist - n/a  Chest x-ray - chest CT 08/12/23 Epic EKG -  06/23/23 Epic Stress Test -  ECHO - 09/11/23 Epic Cardiac Cath -   Bowel Prep - [x]  No  []   Yes ______  Pacemaker / ICD device [x]  No []  Yes   Spinal Cord Stimulator:[x]  No []  Yes       History of Sleep Apnea? [x]  No []  Yes   CPAP used?- [x]  No []  Yes    Does the patient monitor blood sugar?          [x]  No []  Yes  []  N/A  Patient has: [x]  NO Hx DM   []  Pre-DM                 []  DM1  []   DM2 Does patient have a [employer] or Dexacom? []  No []  Yes   Fasting Blood Sugar Ranges-  Checks Blood Sugar _____ times a day  GLP1 agonist / usual dose - no GLP1 instructions:  SGLT-2 inhibitors / usual dose - no SGLT-2 instructions:   Blood Thinner / Instructions:no Aspirin Instructions:no  Comments:   Activity level: Patient is able to climb a flight of stairs without difficulty; [x]  No CP  [x]  No SOB,   Patient can perform ADLs without assistance.   Anesthesia review:   Patient denies shortness of breath, fever, cough and chest pain at PAT appointment.  Patient verbalized understanding and agreement to the Pre-Surgical Instructions that were given to them at this PAT appointment. Patient was also educated of the need to review these PAT instructions again prior to his/her surgery.I reviewed the appropriate phone numbers to call if they have any and questions or concerns.

## 2024-03-09 ENCOUNTER — Encounter (HOSPITAL_COMMUNITY): Payer: Self-pay

## 2024-03-09 ENCOUNTER — Encounter (HOSPITAL_COMMUNITY)
Admission: RE | Admit: 2024-03-09 | Discharge: 2024-03-09 | Disposition: A | Discharge status: Home / Self Care | Source: Ambulatory Visit | Attending: Urology | Admitting: Urology

## 2024-03-09 ENCOUNTER — Other Ambulatory Visit: Payer: Self-pay

## 2024-03-09 DIAGNOSIS — D696 Thrombocytopenia, unspecified: Secondary | ICD-10-CM | POA: Insufficient documentation

## 2024-03-09 DIAGNOSIS — Z1389 Encounter for screening for other disorder: Secondary | ICD-10-CM | POA: Diagnosis not present

## 2024-03-09 DIAGNOSIS — Z01812 Encounter for preprocedural laboratory examination: Secondary | ICD-10-CM | POA: Diagnosis present

## 2024-03-09 HISTORY — DX: Unspecified osteoarthritis, unspecified site: M19.90

## 2024-03-09 HISTORY — DX: Malignant (primary) neoplasm, unspecified: C80.1

## 2024-03-09 HISTORY — DX: Thyrotoxicosis, unspecified without thyrotoxic crisis or storm: E05.90

## 2024-03-09 HISTORY — DX: Other complications of anesthesia, initial encounter: T88.59XA

## 2024-03-09 LAB — CBC
HCT: 36.9 % — ABNORMAL LOW (ref 39.0–52.0)
Hemoglobin: 11.9 g/dL — ABNORMAL LOW (ref 13.0–17.0)
MCH: 30.6 pg (ref 26.0–34.0)
MCHC: 32.2 g/dL (ref 30.0–36.0)
MCV: 94.9 fL (ref 80.0–100.0)
Platelets: 270 K/uL (ref 150–400)
RBC: 3.89 MIL/uL — ABNORMAL LOW (ref 4.22–5.81)
RDW: 15.2 % (ref 11.5–15.5)
WBC: 7.7 K/uL (ref 4.0–10.5)
nRBC: 0 % (ref 0.0–0.2)

## 2024-03-09 LAB — BASIC METABOLIC PANEL WITH GFR
Anion gap: 14 (ref 5–15)
BUN: 11 mg/dL (ref 8–23)
CO2: 26 mmol/L (ref 22–32)
Calcium: 9.9 mg/dL (ref 8.9–10.3)
Chloride: 101 mmol/L (ref 98–111)
Creatinine, Ser: 1.89 mg/dL — ABNORMAL HIGH (ref 0.61–1.24)
GFR, Estimated: 39 mL/min — ABNORMAL LOW (ref >60)
Glucose, Bld: 115 mg/dL — ABNORMAL HIGH (ref 70–99)
Potassium: 3.9 mmol/L (ref 3.5–5.1)
Sodium: 141 mmol/L (ref 135–145)

## 2024-03-10 LAB — URINE CULTURE: Culture: NO GROWTH

## 2024-03-16 NOTE — Progress Notes (Signed)
 Request sent to Dr. Shane to review pt's pre op BMP.

## 2024-03-17 NOTE — H&P (Signed)
 68 year old male referred for elevated PSA, PSA noted to be 13.9 on 09/01/2023. 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   PMH: Stage IIIb CKD, HTN, anemia, tobacco use disorder, alcohol abuse, patient drinks 15 drinks a week. Patient is still smoking. Pt has stopped drinking before without symptoms of withdrawal.  PSH: Rectal abscess between legs, foot surgery, no abdominal surgery.   Elevated PSA:  10/22/2023: PSA 13.9, granfathere had prostate cancer, no GYN or breast cancer, no GH, no UTI, Porstate biopsy in the past 2 years ago. Done in [place]. Bx done in 2023 benign. No blood thinners, no recent abx. Seen by Dr. Twylla previously. TRUS shows 25grams.  12/24/23: PIRAD 4 lesion in gland will set up for fusion Bx string family history of cancer. No GH, No UTI. No PT loss, no Changes in appetite. No blood thinners. Will proceed with fusion bx.  01/07/24: fusion biopsy patient done during case today slightly increased bleeding had to hold pressure bleeding resolved.  01/20/24: GG3 disease L mid and apex. MRI shows 18gram prostate. PSA density 0.6. SHIM 17. AUASS 7. After long discussion patient is leaning towards surgery.  02/25/24: Patient scheduled for surgery on 7/24 for radical prostatectomy. Metastatic workup including bone scan and CT scan negative for metastasis. Discussed with vascular surgeon prior to surgery about SFA occlusion no indications for management. Discussed RALP today  03/18/24: RALP today     ALLERGIES: No Known Allergies    MEDICATIONS: Bactrim  DS 800-160 MG Tablet 1 tablet PO As Directed Please take 1 pill 2 hours prior to biopsy and 1 pill at bedtime after biopsy  Amlodipine  Besilate  Folic Acid  1 MG Tablet  Rosuvastatin  Calcium  10 MG Tablet     GU PSH: Prostate Needle Biopsy - 01/07/2024     NON-GU PSH: Surgical Pathology, Gross And Microscopic Examination For Prostate Needle - 01/07/2024 Visit Complexity (formerly GPC1X) - 01/20/2024, 12/24/2023, 10/22/2023     GU PMH: Prostate  Cancer - 01/29/2024, - 01/20/2024 Elevated PSA - 01/07/2024, - 12/24/2023, - 10/22/2023 BPH w/o LUTS - 10/22/2023    NON-GU PMH: Muscle weakness (generalized) - 01/29/2024    FAMILY HISTORY: No Family History    SOCIAL HISTORY: No Social History    REVIEW OF SYSTEMS:    GU Review Male:   Patient denies frequent urination, hard to postpone urination, burning/ pain with urination, get up at night to urinate, leakage of urine, stream starts and stops, trouble starting your stream, have to strain to urinate , erection problems, and penile pain.  Gastrointestinal (Upper):   Patient denies nausea, vomiting, and indigestion/ heartburn.  Gastrointestinal (Lower):   Patient denies diarrhea and constipation.  Constitutional:   Patient denies fever, night sweats, weight loss, and fatigue.  Skin:   Patient denies skin rash/ lesion and itching.  Eyes:   Patient denies blurred vision and double vision.  Ears/ Nose/ Throat:   Patient denies sore throat and sinus problems.  Hematologic/Lymphatic:   Patient denies swollen glands and easy bruising.  Cardiovascular:   Patient denies leg swelling and chest pains.  Respiratory:   Patient denies cough and shortness of breath.  Endocrine:   Patient denies excessive thirst.  Musculoskeletal:   Patient denies back pain and joint pain.  Neurological:   Patient denies headaches and dizziness.  Psychologic:   Patient denies depression and anxiety.   VITAL SIGNS: None   MULTI-SYSTEM PHYSICAL EXAMINATION:    Constitutional: Well-nourished. No physical deformities. Normally developed. Good grooming.  Respiratory: No labored breathing, no  use of accessory muscles.   Cardiovascular: Normal temperature, normal extremity pulses, no swelling, no varicosities.     Complexity of Data:  Source Of History:  Patient  Records Review:   Previous Patient Records  Urine Test Review:   Urinalysis   10/22/23  PSA  Total PSA 11.70 ng/mL    PROCEDURES:          Visit  Complexity - G2211          Urinalysis w/Scope Dipstick Dipstick Cont'd Micro  Color: Yellow Bilirubin: Neg mg/dL WBC/hpf: NS (Not Seen)  Appearance: Clear Ketones: Neg mg/dL RBC/hpf: 0 - 2/hpf  Specific Gravity: 1.015 Blood: Neg ery/uL Bacteria: Rare (0-9/hpf)  pH: 7.0 Protein: 1+ mg/dL Cystals: Amorph Phosphates  Glucose: Neg mg/dL Urobilinogen: 0.2 mg/dL Casts: Hyaline    Nitrites: Neg Trichomonas: Not Present    Leukocyte Esterase: Neg leu/uL Mucous: Not Present      Epithelial Cells: 0 - 5/hpf      Yeast: NS (Not Seen)      Sperm: Not Present    ASSESSMENT:      ICD-10 Details  1 GU:   Prostate Cancer - C61 Chronic, Stable   PLAN:           Orders Labs BMP, Urine Culture, CBC          Document Letter(s):  Created for Patient: Clinical Summary         Notes:   Prostate cancer: Patient scheduled for radical prostatectomy on 7/24 we will get preoperative physical therapy 7/14.   We discussed the risk benefits alternatives to radical prostatectomy including bleeding infection demonstrating structures possible anastomotic leak, possible lymphatic leak possible injury to the colon possible inability to remove all cancer possible hernia possible DVT PE and CVA. Patient voiced understanding consent was obtained labs are ordered today patient will get a type and screen prior to surgery urine culture ordered today.   Urine culture negative will plan for RALP with PLND today

## 2024-03-18 ENCOUNTER — Encounter (HOSPITAL_COMMUNITY): Payer: Self-pay | Admitting: Anesthesiology

## 2024-03-18 ENCOUNTER — Encounter (HOSPITAL_COMMUNITY): Payer: Self-pay | Admitting: Urology

## 2024-03-18 ENCOUNTER — Other Ambulatory Visit: Payer: Self-pay

## 2024-03-18 ENCOUNTER — Observation Stay (HOSPITAL_COMMUNITY)
Admission: RE | Admit: 2024-03-18 | Discharge: 2024-03-19 | Disposition: A | Discharge status: Home / Self Care | Attending: Urology | Admitting: Urology

## 2024-03-18 ENCOUNTER — Encounter (HOSPITAL_COMMUNITY)
Admission: RE | Disposition: A | Discharge status: Home / Self Care | Payer: Self-pay | Source: Home / Self Care | Attending: Urology

## 2024-03-18 ENCOUNTER — Ambulatory Visit (HOSPITAL_COMMUNITY): Payer: Self-pay | Admitting: Anesthesiology

## 2024-03-18 DIAGNOSIS — N1832 Chronic kidney disease, stage 3b: Secondary | ICD-10-CM

## 2024-03-18 DIAGNOSIS — F17200 Nicotine dependence, unspecified, uncomplicated: Secondary | ICD-10-CM | POA: Insufficient documentation

## 2024-03-18 DIAGNOSIS — C61 Malignant neoplasm of prostate: Secondary | ICD-10-CM

## 2024-03-18 DIAGNOSIS — Z79899 Other long term (current) drug therapy: Secondary | ICD-10-CM | POA: Insufficient documentation

## 2024-03-18 DIAGNOSIS — Z720 Tobacco use: Secondary | ICD-10-CM | POA: Diagnosis not present

## 2024-03-18 DIAGNOSIS — J449 Chronic obstructive pulmonary disease, unspecified: Secondary | ICD-10-CM | POA: Diagnosis not present

## 2024-03-18 DIAGNOSIS — I129 Hypertensive chronic kidney disease with stage 1 through stage 4 chronic kidney disease, or unspecified chronic kidney disease: Secondary | ICD-10-CM | POA: Insufficient documentation

## 2024-03-18 DIAGNOSIS — F1010 Alcohol abuse, uncomplicated: Secondary | ICD-10-CM | POA: Diagnosis not present

## 2024-03-18 HISTORY — PX: ROBOT ASSISTED LAPAROSCOPIC RADICAL PROSTATECTOMY: SHX5141

## 2024-03-18 LAB — ABO/RH: ABO/RH(D): O POS

## 2024-03-18 LAB — TYPE AND SCREEN
ABO/RH(D): O POS
Antibody Screen: NEGATIVE

## 2024-03-18 LAB — HEMOGLOBIN AND HEMATOCRIT, BLOOD
HCT: 28 % — ABNORMAL LOW (ref 39.0–52.0)
Hemoglobin: 8.8 g/dL — ABNORMAL LOW (ref 13.0–17.0)

## 2024-03-18 SURGERY — PROSTATECTOMY, RADICAL, ROBOT-ASSISTED, LAPAROSCOPIC
Anesthesia: General

## 2024-03-18 MED ORDER — ORAL CARE MOUTH RINSE: 15.0000 mL | Freq: Once | OROMUCOSAL | Status: AC

## 2024-03-18 MED ORDER — BUPIVACAINE LIPOSOME 1.3 % IJ SUSP
INTRAMUSCULAR | Status: AC
  Filled 2024-03-18: qty 20

## 2024-03-18 MED ORDER — CHLORHEXIDINE GLUCONATE CLOTH 2 % EX PADS
6.0000 | MEDICATED_PAD | Freq: Every day | CUTANEOUS | Status: DC
  Administered 2024-03-19: 6 via TOPICAL

## 2024-03-18 MED ORDER — SODIUM CHLORIDE (PF) 0.9 % IJ SOLN
INTRAMUSCULAR | Status: AC
  Filled 2024-03-18: qty 20

## 2024-03-18 MED ORDER — PHENYLEPHRINE HCL (PRESSORS) 10 MG/ML IV SOLN
INTRAVENOUS | Status: AC
Start: 2024-03-18 — End: 2024-03-18
  Filled 2024-03-18: qty 1

## 2024-03-18 MED ORDER — FENTANYL CITRATE (PF) 100 MCG/2ML IJ SOLN
INTRAMUSCULAR | Status: DC | PRN
  Administered 2024-03-18 (×2): 50 ug via INTRAVENOUS

## 2024-03-18 MED ORDER — LIDOCAINE HCL (CARDIAC) PF 100 MG/5ML IV SOSY
PREFILLED_SYRINGE | INTRAVENOUS | Status: DC | PRN
  Administered 2024-03-18: 60 mg via INTRAVENOUS

## 2024-03-18 MED ORDER — HEMOSTATIC AGENTS (NO CHARGE) OPTIME
TOPICAL | Status: DC | PRN
  Administered 2024-03-18: 1 via TOPICAL

## 2024-03-18 MED ORDER — HEPARIN SODIUM (PORCINE) 5000 UNIT/ML IJ SOLN
5000.0000 [IU] | Freq: Once | INTRAMUSCULAR | Status: AC
  Administered 2024-03-18: 5000 [IU] via SUBCUTANEOUS
  Filled 2024-03-18: qty 1

## 2024-03-18 MED ORDER — MIDAZOLAM HCL 5 MG/5ML IJ SOLN
INTRAMUSCULAR | Status: DC | PRN
Start: 2024-03-18 — End: 2024-03-18
  Administered 2024-03-18: 2 mg via INTRAVENOUS

## 2024-03-18 MED ORDER — LACTATED RINGERS IV SOLN: INTRAVENOUS | Status: DC | PRN

## 2024-03-18 MED ORDER — OXYCODONE HCL 5 MG/5ML PO SOLN: 5.0000 mg | Freq: Once | ORAL | Status: DC | PRN

## 2024-03-18 MED ORDER — SULFAMETHOXAZOLE-TRIMETHOPRIM 800-160 MG PO TABS
1.0000 | ORAL_TABLET | Freq: Two times a day (BID) | ORAL | 0 refills | Status: DC
Start: 2024-03-18 — End: 2024-06-07

## 2024-03-18 MED ORDER — IRBESARTAN 75 MG PO TABS
37.5000 mg | ORAL_TABLET | Freq: Every day | ORAL | Status: DC
  Administered 2024-03-18 – 2024-03-19 (×2): 37.5 mg via ORAL
  Filled 2024-03-18: qty 0.5
  Filled 2024-03-18: qty 1
  Filled 2024-03-18: qty 0.5

## 2024-03-18 MED ORDER — DOCUSATE SODIUM 100 MG PO CAPS
100.0000 mg | ORAL_CAPSULE | Freq: Two times a day (BID) | ORAL | Status: DC
  Administered 2024-03-18 – 2024-03-19 (×2): 100 mg via ORAL
  Filled 2024-03-18 (×2): qty 1

## 2024-03-18 MED ORDER — PROPOFOL 10 MG/ML IV BOLUS
INTRAVENOUS | Status: AC
Start: 2024-03-18 — End: 2024-03-18
  Filled 2024-03-18: qty 20

## 2024-03-18 MED ORDER — ROCURONIUM BROMIDE 10 MG/ML (PF) SYRINGE
PREFILLED_SYRINGE | INTRAVENOUS | Status: AC
Start: 2024-03-18 — End: 2024-03-18
  Filled 2024-03-18: qty 10

## 2024-03-18 MED ORDER — BUPIVACAINE LIPOSOME 1.3 % IJ SUSP
INTRAMUSCULAR | Status: DC | PRN
  Administered 2024-03-18: 20 mL

## 2024-03-18 MED ORDER — ROCURONIUM BROMIDE 100 MG/10ML IV SOLN
INTRAVENOUS | Status: DC | PRN
  Administered 2024-03-18 (×2): 20 mg via INTRAVENOUS
  Administered 2024-03-18: 50 mg via INTRAVENOUS
  Administered 2024-03-18: 20 mg via INTRAVENOUS
  Administered 2024-03-18: 10 mg via INTRAVENOUS

## 2024-03-18 MED ORDER — CEFAZOLIN SODIUM-DEXTROSE 2-4 GM/100ML-% IV SOLN
2.0000 g | INTRAVENOUS | Status: AC
  Administered 2024-03-18: 2 g via INTRAVENOUS
  Filled 2024-03-18: qty 100

## 2024-03-18 MED ORDER — ONDANSETRON HCL 4 MG/2ML IJ SOLN
4.0000 mg | INTRAMUSCULAR | Status: DC | PRN
Start: 2024-03-18 — End: 2024-03-19

## 2024-03-18 MED ORDER — ONDANSETRON HCL 4 MG/2ML IJ SOLN: 4.0000 mg | Freq: Once | INTRAMUSCULAR | Status: DC | PRN

## 2024-03-18 MED ORDER — ACETAMINOPHEN 500 MG PO TABS
1000.0000 mg | ORAL_TABLET | Freq: Once | ORAL | Status: AC
  Administered 2024-03-18: 1000 mg via ORAL
  Filled 2024-03-18: qty 2

## 2024-03-18 MED ORDER — LACTATED RINGERS IR SOLN
Status: DC | PRN
  Administered 2024-03-18: 1000 mL

## 2024-03-18 MED ORDER — ACETAMINOPHEN 500 MG PO TABS
1000.0000 mg | ORAL_TABLET | Freq: Four times a day (QID) | ORAL | Status: AC
  Administered 2024-03-18 – 2024-03-19 (×4): 1000 mg via ORAL
  Filled 2024-03-18 (×4): qty 2

## 2024-03-18 MED ORDER — LORAZEPAM 2 MG/ML IJ SOLN: 1.0000 mg | INTRAMUSCULAR | Status: DC | PRN

## 2024-03-18 MED ORDER — KETAMINE HCL 50 MG/5ML IJ SOSY
PREFILLED_SYRINGE | INTRAMUSCULAR | Status: AC
  Filled 2024-03-18: qty 5

## 2024-03-18 MED ORDER — PROPOFOL 10 MG/ML IV BOLUS
INTRAVENOUS | Status: DC | PRN
  Administered 2024-03-18: 150 mg via INTRAVENOUS
  Administered 2024-03-18: 50 mg via INTRAVENOUS

## 2024-03-18 MED ORDER — ONDANSETRON HCL 4 MG/2ML IJ SOLN
INTRAMUSCULAR | Status: AC
  Filled 2024-03-18: qty 2

## 2024-03-18 MED ORDER — HYDROMORPHONE HCL 1 MG/ML IJ SOLN: 0.5000 mg | INTRAMUSCULAR | Status: DC | PRN

## 2024-03-18 MED ORDER — BOOST / RESOURCE BREEZE PO LIQD CUSTOM: 1.0000 | Freq: Three times a day (TID) | ORAL | Status: DC

## 2024-03-18 MED ORDER — ROSUVASTATIN CALCIUM 10 MG PO TABS
10.0000 mg | ORAL_TABLET | Freq: Every day | ORAL | Status: DC
  Administered 2024-03-19: 10 mg via ORAL
  Filled 2024-03-18 (×2): qty 1

## 2024-03-18 MED ORDER — CEFAZOLIN SODIUM 1 G IJ SOLR
INTRAMUSCULAR | Status: AC
  Filled 2024-03-18: qty 20

## 2024-03-18 MED ORDER — LACTATED RINGERS IV SOLN: INTRAVENOUS | Status: DC

## 2024-03-18 MED ORDER — ROCURONIUM BROMIDE 10 MG/ML (PF) SYRINGE
PREFILLED_SYRINGE | INTRAVENOUS | Status: AC
  Filled 2024-03-18: qty 10

## 2024-03-18 MED ORDER — TRIPLE ANTIBIOTIC 3.5-400-5000 EX OINT: 1.0000 | TOPICAL_OINTMENT | Freq: Three times a day (TID) | CUTANEOUS | Status: DC | PRN

## 2024-03-18 MED ORDER — HYOSCYAMINE SULFATE 0.125 MG SL SUBL: 0.1250 mg | SUBLINGUAL_TABLET | SUBLINGUAL | Status: DC | PRN

## 2024-03-18 MED ORDER — BUPIVACAINE HCL (PF) 0.25 % IJ SOLN
INTRAMUSCULAR | Status: DC | PRN
  Administered 2024-03-18: 30 mL

## 2024-03-18 MED ORDER — AMLODIPINE BESYLATE 10 MG PO TABS
5.0000 mg | ORAL_TABLET | Freq: Every day | ORAL | Status: DC
  Administered 2024-03-19: 5 mg via ORAL
  Filled 2024-03-18: qty 1

## 2024-03-18 MED ORDER — SUGAMMADEX SODIUM 200 MG/2ML IV SOLN
INTRAVENOUS | Status: DC | PRN
  Administered 2024-03-18: 200 mg via INTRAVENOUS

## 2024-03-18 MED ORDER — AMISULPRIDE (ANTIEMETIC) 5 MG/2ML IV SOLN: 10.0000 mg | Freq: Once | INTRAVENOUS | Status: DC | PRN

## 2024-03-18 MED ORDER — SODIUM CHLORIDE 0.45 % IV SOLN: INTRAVENOUS | Status: AC

## 2024-03-18 MED ORDER — BUPIVACAINE HCL (PF) 0.25 % IJ SOLN
INTRAMUSCULAR | Status: AC
Start: 2024-03-18 — End: 2024-03-18
  Filled 2024-03-18: qty 30

## 2024-03-18 MED ORDER — THIAMINE MONONITRATE 100 MG PO TABS
100.0000 mg | ORAL_TABLET | ORAL | Status: DC
  Administered 2024-03-18: 100 mg via ORAL
  Filled 2024-03-18 (×2): qty 1

## 2024-03-18 MED ORDER — DIPHENHYDRAMINE HCL 12.5 MG/5ML PO ELIX: 12.5000 mg | ORAL_SOLUTION | Freq: Four times a day (QID) | ORAL | Status: DC | PRN

## 2024-03-18 MED ORDER — SODIUM CHLORIDE 0.9 % IV BOLUS
1000.0000 mL | Freq: Once | INTRAVENOUS | Status: AC
Start: 2024-03-18 — End: 2024-03-19
  Administered 2024-03-18: 1000 mL via INTRAVENOUS

## 2024-03-18 MED ORDER — POLYETHYLENE GLYCOL 3350 17 G PO PACK: 17.0000 g | PACK | Freq: Every day | ORAL | 0 refills | Status: AC

## 2024-03-18 MED ORDER — PHENYLEPHRINE HCL-NACL 20-0.9 MG/250ML-% IV SOLN
INTRAVENOUS | Status: DC | PRN
Start: 2024-03-18 — End: 2024-03-18
  Administered 2024-03-18: 50 ug/min via INTRAVENOUS

## 2024-03-18 MED ORDER — METHOCARBAMOL 500 MG PO TABS
1000.0000 mg | ORAL_TABLET | Freq: Four times a day (QID) | ORAL | Status: DC
  Administered 2024-03-18 – 2024-03-19 (×4): 1000 mg via ORAL
  Filled 2024-03-18 (×4): qty 2

## 2024-03-18 MED ORDER — DEXAMETHASONE SODIUM PHOSPHATE 10 MG/ML IJ SOLN
INTRAMUSCULAR | Status: AC
  Filled 2024-03-18: qty 1

## 2024-03-18 MED ORDER — FENTANYL CITRATE (PF) 100 MCG/2ML IJ SOLN
INTRAMUSCULAR | Status: AC
  Filled 2024-03-18: qty 2

## 2024-03-18 MED ORDER — HYDROMORPHONE HCL 1 MG/ML IJ SOLN
INTRAMUSCULAR | Status: DC | PRN
  Administered 2024-03-18 (×2): .2 mg via INTRAVENOUS

## 2024-03-18 MED ORDER — KETAMINE HCL 50 MG/5ML IJ SOSY
PREFILLED_SYRINGE | INTRAMUSCULAR | Status: DC | PRN
  Administered 2024-03-18: 25 mg via INTRAVENOUS

## 2024-03-18 MED ORDER — PANTOPRAZOLE SODIUM 40 MG PO TBEC
40.0000 mg | DELAYED_RELEASE_TABLET | Freq: Every day | ORAL | Status: DC
  Administered 2024-03-18 – 2024-03-19 (×2): 40 mg via ORAL
  Filled 2024-03-18 (×2): qty 1

## 2024-03-18 MED ORDER — LIDOCAINE HCL (PF) 2 % IJ SOLN
INTRAMUSCULAR | Status: AC
Start: 2024-03-18 — End: 2024-03-18
  Filled 2024-03-18: qty 5

## 2024-03-18 MED ORDER — CHLORHEXIDINE GLUCONATE 0.12 % MT SOLN
15.0000 mL | Freq: Once | OROMUCOSAL | Status: AC
  Administered 2024-03-18: 15 mL via OROMUCOSAL

## 2024-03-18 MED ORDER — DEXAMETHASONE SODIUM PHOSPHATE 10 MG/ML IJ SOLN
INTRAMUSCULAR | Status: DC | PRN
Start: 2024-03-18 — End: 2024-03-18
  Administered 2024-03-18: 8 mg via INTRAVENOUS

## 2024-03-18 MED ORDER — DIPHENHYDRAMINE HCL 50 MG/ML IJ SOLN
12.5000 mg | Freq: Four times a day (QID) | INTRAMUSCULAR | Status: DC | PRN
Start: 2024-03-18 — End: 2024-03-19

## 2024-03-18 MED ORDER — PHENYLEPHRINE HCL (PRESSORS) 10 MG/ML IV SOLN
INTRAVENOUS | Status: DC | PRN
  Administered 2024-03-18: 160 ug via INTRAVENOUS

## 2024-03-18 MED ORDER — PHENYLEPHRINE 80 MCG/ML (10ML) SYRINGE FOR IV PUSH (FOR BLOOD PRESSURE SUPPORT)
PREFILLED_SYRINGE | INTRAVENOUS | Status: AC
  Filled 2024-03-18: qty 10

## 2024-03-18 MED ORDER — HYDROCODONE-ACETAMINOPHEN 5-325 MG PO TABS: 1.0000 | ORAL_TABLET | Freq: Four times a day (QID) | ORAL | 0 refills | Status: DC | PRN

## 2024-03-18 MED ORDER — MIDAZOLAM HCL 2 MG/2ML IJ SOLN
INTRAMUSCULAR | Status: AC
  Filled 2024-03-18: qty 2

## 2024-03-18 MED ORDER — METHOCARBAMOL 750 MG PO TABS: 750.0000 mg | ORAL_TABLET | Freq: Four times a day (QID) | ORAL | 0 refills | Status: AC

## 2024-03-18 MED ORDER — HYDROMORPHONE HCL 1 MG/ML IJ SOLN: 0.2500 mg | INTRAMUSCULAR | Status: DC | PRN

## 2024-03-18 MED ORDER — OXYCODONE HCL 5 MG PO TABS: 5.0000 mg | ORAL_TABLET | Freq: Once | ORAL | Status: DC | PRN

## 2024-03-18 MED ORDER — LORAZEPAM 1 MG PO TABS: 1.0000 mg | ORAL_TABLET | ORAL | Status: DC | PRN

## 2024-03-18 MED ORDER — ONDANSETRON HCL 4 MG/2ML IJ SOLN
INTRAMUSCULAR | Status: DC | PRN
  Administered 2024-03-18: 4 mg via INTRAVENOUS

## 2024-03-18 MED ORDER — OXYCODONE HCL 5 MG PO TABS: 5.0000 mg | ORAL_TABLET | ORAL | Status: DC | PRN

## 2024-03-18 MED ORDER — DOCUSATE SODIUM 100 MG PO CAPS: 100.0000 mg | ORAL_CAPSULE | Freq: Two times a day (BID) | ORAL | Status: AC

## 2024-03-18 MED ORDER — HYDROMORPHONE HCL 2 MG/ML IJ SOLN
INTRAMUSCULAR | Status: AC
  Filled 2024-03-18: qty 1

## 2024-03-18 SURGICAL SUPPLY — 61 items
APPLICATOR COTTON TIP 6 STRL (MISCELLANEOUS) ×2 IMPLANT
APPLICATOR SURGIFLO ENDO (HEMOSTASIS) ×2 IMPLANT
BAG COUNTER SPONGE SURGICOUNT (BAG) IMPLANT
CATH FOLEY 2WAY SLVR 5CC 18FR (CATHETERS) ×2 IMPLANT
CATH ROBINSON RED A/P 16FR (CATHETERS) ×2 IMPLANT
CATH TIEMANN FOLEY 18FR 5CC (CATHETERS) ×2 IMPLANT
CHLORAPREP W/TINT 26 (MISCELLANEOUS) ×2 IMPLANT
CLIP LIGATING HEM O LOK PURPLE (MISCELLANEOUS) ×4 IMPLANT
COVER SURGICAL LIGHT HANDLE (MISCELLANEOUS) ×2 IMPLANT
COVER TIP SHEARS 8 DVNC (MISCELLANEOUS) ×2 IMPLANT
CUTTER ECHEON FLEX ENDO 45 340 (ENDOMECHANICALS) ×2 IMPLANT
DERMABOND ADVANCED .7 DNX12 (GAUZE/BANDAGES/DRESSINGS) ×4 IMPLANT
DRAPE ARM DVNC X/XI (DISPOSABLE) ×8 IMPLANT
DRAPE COLUMN DVNC XI (DISPOSABLE) ×2 IMPLANT
DRAPE SURG IRRIG POUCH 19X23 (DRAPES) ×2 IMPLANT
DRIVER NDL LRG 8 DVNC XI (INSTRUMENTS) ×4 IMPLANT
DRIVER NDLE LRG 8 DVNC XI (INSTRUMENTS) ×4 IMPLANT
DRSG TEGADERM 4X4.75 (GAUZE/BANDAGES/DRESSINGS) ×2 IMPLANT
ELECT PENCIL ROCKER SW 15FT (MISCELLANEOUS) ×2 IMPLANT
ELECT REM PT RETURN 15FT ADLT (MISCELLANEOUS) ×2 IMPLANT
FORCEPS BPLR LNG DVNC XI (INSTRUMENTS) ×2 IMPLANT
FORCEPS PROGRASP DVNC XI (FORCEP) ×2 IMPLANT
GAUZE 4X4 16PLY ~~LOC~~+RFID DBL (SPONGE) IMPLANT
GAUZE SPONGE 2X2 8PLY STRL LF (GAUZE/BANDAGES/DRESSINGS) IMPLANT
GAUZE SPONGE 4X4 12PLY STRL (GAUZE/BANDAGES/DRESSINGS) ×2 IMPLANT
GLOVE BIO SURGEON STRL SZ 6.5 (GLOVE) ×2 IMPLANT
GLOVE BIO SURGEON STRL SZ8 (GLOVE) ×4 IMPLANT
GLOVE BIOGEL PI IND STRL 8.5 (GLOVE) ×4 IMPLANT
GOWN STRL REUS W/ TWL XL LVL3 (GOWN DISPOSABLE) ×4 IMPLANT
GOWN STRL SURGICAL XL XLNG (GOWN DISPOSABLE) ×2 IMPLANT
HEMOSTAT SURGICEL 2X4 FIBR (HEMOSTASIS) ×2 IMPLANT
HOLDER FOLEY CATH W/STRAP (MISCELLANEOUS) ×2 IMPLANT
IRRIGATION SUCT STRKRFLW 2 WTP (MISCELLANEOUS) ×2 IMPLANT
IV LACTATED RINGERS 1000ML (IV SOLUTION) IMPLANT
KIT TURNOVER KIT A (KITS) ×2 IMPLANT
MARKER SKIN DUAL TIP RULER LAB (MISCELLANEOUS) IMPLANT
NDL INSUFFLATION 14GA 120MM (NEEDLE) ×2 IMPLANT
NEEDLE INSUFFLATION 14GA 120MM (NEEDLE) ×2 IMPLANT
PACK ROBOT UROLOGY CUSTOM (CUSTOM PROCEDURE TRAY) ×2 IMPLANT
PAD POSITIONING PINK XL (MISCELLANEOUS) ×2 IMPLANT
PLUG CATH AND CAP STRL 200 (CATHETERS) IMPLANT
PORT ACCESS TROCAR AIRSEAL 12 (TROCAR) ×2 IMPLANT
RELOAD STAPLE 45 4.1 GRN THCK (STAPLE) ×2 IMPLANT
SCISSORS MNPLR CVD DVNC XI (INSTRUMENTS) ×2 IMPLANT
SEAL UNIV 5-12 XI (MISCELLANEOUS) ×8 IMPLANT
SET TRI-LUMEN FLTR TB AIRSEAL (TUBING) ×2 IMPLANT
SOL PREP POV-IOD 4OZ 10% (MISCELLANEOUS) ×2 IMPLANT
SOLUTION ELECTROSURG ANTI STCK (MISCELLANEOUS) ×2 IMPLANT
SPIKE FLUID TRANSFER (MISCELLANEOUS) ×2 IMPLANT
SURGIFLO W/THROMBIN 8M KIT (HEMOSTASIS) ×2 IMPLANT
SUT ETHILON 3 0 PS 1 (SUTURE) ×2 IMPLANT
SUT MNCRL AB 4-0 PS2 18 (SUTURE) ×4 IMPLANT
SUT PDS AB 0 CT1 36 (SUTURE) ×4 IMPLANT
SUT VIC AB 0 CT1 27XBRD ANTBC (SUTURE) ×2 IMPLANT
SUT VIC AB 2-0 SH 27X BRD (SUTURE) ×4 IMPLANT
SUT VIC AB 3-0 SH 27XBRD (SUTURE) IMPLANT
SUT VICRYL 0 UR6 27IN ABS (SUTURE) IMPLANT
SUT VLOC 3-0 9IN GRN (SUTURE) ×2 IMPLANT
SUTURE VLOC BRB 180 ABS3/0GR12 (SUTURE) ×4 IMPLANT
SYRINGE TOOMEY IRRIG 70ML (MISCELLANEOUS) IMPLANT
WATER STERILE IRR 1000ML POUR (IV SOLUTION) ×2 IMPLANT

## 2024-03-18 NOTE — Anesthesia Procedure Notes (Signed)
 Procedure Name: Intubation Date/Time: 03/18/2024 7:36 AM  Performed by: Obadiah Reyes BROCKS, CRNAPre-anesthesia Checklist: Patient identified, Emergency Drugs available, Suction available and Patient being monitored Patient Re-evaluated:Patient Re-evaluated prior to induction Oxygen Delivery Method: Circle System Utilized Preoxygenation: Pre-oxygenation with 100% oxygen Induction Type: IV induction Ventilation: Mask ventilation without difficulty Laryngoscope Size: Miller and 2 Grade View: Grade I Tube type: Oral Number of attempts: 1 Airway Equipment and Method: Stylet and Oral airway Placement Confirmation: ETT inserted through vocal cords under direct vision, positive ETCO2 and breath sounds checked- equal and bilateral Secured at: 20 cm Tube secured with: Tape Dental Injury: Teeth and Oropharynx as per pre-operative assessment

## 2024-03-18 NOTE — Op Note (Signed)
 Operative Note  Preoperative diagnosis:  1.  Localized prostate cancer  Postoperative diagnosis: 1.  Localized prostate cancer  Procedure(s): 1.  Robotic assisted laparoscopic radical prostatectomy (Right nerve sparing) 2.  Robotic assisted laparoscopic bilateral pelvic lymph node dissection  Surgeon: Steffan Pea, MD  Assistants:   Alan Hammonds, PA  An assistant was required for this surgical procedure.  The duties of the assistant included but were not limited to suctioning, passing suture, camera manipulation, retraction.  This procedure would not be able to be performed without an Geophysicist/field seismologist.     Anesthesia:  General  Complications:  None  EBL:  100  Specimens: 1.  Prostate with seminal vesicles 2.  Periprostatic fat 3. Bilateral pelvic lymph nodes  Drains/Catheters: 1.  18 French Foley catheter  Intraoperative findings:   18 g prostate, posterior aspect prostate very stuck.  Right seminal vesicle was pulled off prostate on and we found left in situ.  Bilateral meds taken without complication.  Leak test at the end the procedure showed no leak.  Indication:  George MINJARES is a 68 y.o. malewho initially presented with an elevated PSA.  Prostate biopsy showed Gleason 4+3 prostate cancer involving the Left side of the prostate.  Treatment options were discussed with him at length and he chose robotic assisted laparoscopic radical prostatectomy.  He has Left sided and the plan is for Right nerve sparing.  Bilateral pelvic lymph node dissection was planned due to his risk stratification.  Description of procedure: The indications, alternatives, benefits, and risks were discussed with the patient and informed symptoms obtained.  The patient was brought to the operating room table, positioned supine and secured to the bed with a safety strap.  All pressure points were carefully padded and pneumatic compression devices were placed on lower extremities.  After the  administration of intravenous antibiotics, subcutaneous heparin  preoperatively  and general endotracheal anesthesia, the patient was repositioned in the dorsal lithotomy position using well-padded Allen stirrups.  The arms were carefully tucked at the patient's side and secured with padding.  The chest was secured in place with foam padding and cloth tape and the table was positioned in approximately 30 degree Trendelenburg.  The patient's abdomen, genitalia, and upper thighs were prepped and draped in the standard sterile manner.  A time was completed, verifying the correct patient, surgical procedure and positioning prior to beginning the procedure.  An 72 French urethral catheter was inserted to drain the bladder.  Incision was made 1cm above the umbilicus. Dissection was taken down to the fascia.  Pneumoperitoneum was introduced by placing a Veress needle into the abdomen superior to the umbilicus and insufflated with CO2 to a pressure of 15 mmHg. An 12 mm visiport was placed 1 cm of the umbilicus..  The 0 degree camera was then passed under direct visualization.  The abdominal cavity was examined for any sign of injury, adhesions, and identification of anatomic landmarks.  The remainder of the trochars were placed which included 2 separate 8 millimeter robotic trochars which were placed 9 cm laterally and inferiorly to the initially placed camera trocar.  A 12 mm trocar was placed 8 mm lateral to the right robotic trocar.  A separate 8 mm robotic trocar was placed 8 cm lateral to the previously placed left robotic trocar on the left side.  A 5 mm trocar was placed to the right and well above the umbilicus which approximately 10 and 12 cm away from the right-sided trochars.  The robot was  then docked.  I placed monopolar scissors in the right hand, a fenestrated bipolar in the left hand and a prograsp in the fourth arm.  The sigmoid colon was then released from its lateral attachments setting to be  retracted cranially.    The urachus and median umbilical ligament was divided and we developed the space of Retzius down to the pubic bone.  I divided the parietal peritoneum laterally up to the vas deferens on each side.  Using the prograsp forcep to provide cranial traction on the urachus, the prostate was then defatted above the prostatic vesicle junction, and the superficial dorsal venous complex was coagulated with bipolar and divided.  We submitted the periprostatic fat for pathological specimen.  The endopelvic fascia was sharply opened bilaterally and the levator muscle fibers were swept posterior laterally allowing for visualization of the deep dorsal venous complex and apex of the prostate.  The puboprostatic ligaments were sharply divided and care was taken to preserve the dorsal venous complex.  The dorsal venous complex was then stapled using a 45 mm endovascular stapler.   0 degree lens was kept in place. I identified the bladder neck by pulling a Foley catheter.  The fourth arm was applying cranial traction on the urachus.  I divided the anterior bladder neck musculature until I found the anterior bladder neck mucosa which was then incised. I identified the Foley catheter within, the balloon was deflated, we pulled the Foley catheter out into the operating field.  The assistant then used a grasper to apply traction to the Foley catheter and the surgical tech then placed a Martell on the catheter near the penis for optimal retraction.  I then divided the lateral bladder neck mucosa in the posterior bladder neck mucosa.   I was well away from the bilateral ureteral orifices.  I divided the posterior bladder neck musculature until I discovered the longitudinal fibers and kept dissecting until I found the vas deferens.    Giving the 0 degree lens in place.  I divided the Denonvilliers fascia beneath the prostate and developed the prostate off the rectum.  Due to not doing a posterior dissection  when the posterior bladder neck was taken through a plane was taken above the vas deferens.  This was then redirected to come posterior to the seminal vesicle.  The left seminal vesicle was then dissected out the right seminal vesicle was not able to be identified.  This plane was bluntly developed towards the apex of the prostate.  I then addressed the pedicles, first starting with the right and then moving towards the left. I then did a nerve spare by dividing the lateral pelvic fascia off of the prostatic capsule laterally.  I then isolated the pedicles of the prostate and used the bipolar cautery to burn the pedicles and then divided the pedicles with cold scissors.  I continued to divide the neurovascular bundles off of the prostate out to the apex of the prostate.  At this point the prostate was essentially freed up except for the urethra.   .  The anterior urethra was then sharply divided with cold scissors.  The Foley catheter was then pulled back and we divided the posterior urethral wall.  Compression patent venous sinuses were then oversewn with a 4-0 Vicryl suture.  The specimen was then placed in a Endo Catch bag and then the bag was placed in the upper abdomen out of the way.  I then irrigated the pelvis.  We performed a  rectal test by instilling air into the rectal Foley.  The test was negative.  There is no concern for rectal injury.  I then over sewed a few bleeders alongside the pedicles with a 4-0 Vicryl suture on an RB1 needle.  I then performed a bilateral pelvic lymph node dissection by incising the fascia overlying the right external iliac vein, dissecting distally.  I went just distal to the node of Cloquet were replaced clips and then divided the lymphatics.  The lateral aspect of the dissection was the pelvic sidewall, inferior was the obturator nerve and proximal of the hypogastric vessels.  I placed clips at the proximal aspect and then divided the lymphatics.  The specimen was  removed with the scope grasper and sent to pathology.  Grossly, there were no enlarged lymph nodes.  This was performed on both the right and left pelvic lymph nodes.  With good hemostasis confirmed, I then did the posterior reconstruction with a Rocco stitch.  I used a 3-0 Vloc double-armed suture on an RB1 needle.  This was challenging due to the urethra being significantly retracted only 1 Rocco suture was thrown through the urethra in the right to left lateral fashion.  Then through the base of the bladder neck then again through the base of the rectourethralis black through the bladder neck.  I then completed the urethrovesical anastomosis using a 3-0 Vloc suture on an RB1 needle.  I passed both ends of the suture from the outside in through the bladder neck at the 6 o'clock position.  I passed both through the urethral stump from the inside out and the corresponding position.  I reapproximated the bladder neck to the urethra.  I then ran the left suture on the left side anastomosis to the 9 o'clock position.  And then went back to the right-sided suture around that up the right side of the 12 o'clock position.  I then continued the left suture to the 12 o'clock position. I identified the ureteral orifices and ensured that these were not incorporated with the sutures.  Due to the bladder neck being so tiny several attempts were made to perform the anastomosis due to difficulty visualize bladder opening as well as significantly retracted urethra.  I then placed a new 18 French Foley catheter into the bladder and filled it with 10 cc of sterile water.  I then secured the knot and then passed the suture behind the pubic bone for anterior suspension.  The bladder was irrigated with 150 cc of water.  There was no leak.  Hemostasis was excellent.  A 19 [place] Blake drain was placed through the previously placed fourth arm.  We did place a Carter-Thomason 0 vicryl suture through the 12 mm assistant port. The  robot was undocked and all the trochars were removed under direct vision.    I enlarged the umbilical trocar site large enough to remove the prostate and closed the fascia with 0 PDS sutures in figure-of-eight interrupted fashion.  All the port sites were irrigated.  Exparel  was injected to the trocar sites.  The skin was closed with 4-0 Monocryl in a running subcuticular fashion.  Skin glue was applied.  At this point, the patient was extubated and awakened in the operating room and taken to recovery room in stable condition.  There were no immediate complications.  All counts were correct.  Plan: Admit for observation overnight.  Clear liquids tonight.  Regular for breakfast tomorrow.  Anticipate discharge home tomorrow.  Steffan  [facility]

## 2024-03-18 NOTE — Anesthesia Preprocedure Evaluation (Addendum)
 Anesthesia Evaluation  Patient identified by MRN, date of birth, ID band Patient awake    Reviewed: Allergy & Precautions, NPO status , Patient's Chart, lab work & pertinent test results  History of Anesthesia Complications (+) PROLONGED EMERGENCE and history of anesthetic complications (remote hx prolonged emergence)  Airway Mallampati: III  TM Distance: >3 FB Neck ROM: Full    Dental  (+) Edentulous Upper, Partial Lower   Pulmonary COPD, Current Smoker 4cigg/d   Pulmonary exam normal breath sounds clear to auscultation       Cardiovascular hypertension (131/76 preop, took amlodipine  this AM, benicar last took 2d ago), Pt. on medications Normal cardiovascular exam Rhythm:Regular Rate:Normal     Neuro/Psych negative neurological ROS  negative psych ROS   GI/Hepatic negative GI ROS,,,(+)       alcohol use  Endo/Other  negative endocrine ROS    Renal/GU CRFRenal diseaseCr 1.89   Prostate ca     Musculoskeletal  (+) Arthritis , Osteoarthritis,    Abdominal   Peds  Hematology  (+) Blood dyscrasia, anemia Hb 11.9, plt 270   Anesthesia Other Findings   Reproductive/Obstetrics negative OB ROS                              Anesthesia Physical Anesthesia Plan  ASA: 3  Anesthesia Plan: General   Post-op Pain Management: Tylenol  PO (pre-op)* and Ketamine  IV*   Induction: Intravenous  PONV Risk Score and Plan: 2 and Ondansetron , Dexamethasone , Midazolam  and Treatment may vary due to age or medical condition  Airway Management Planned: Oral ETT  Additional Equipment:   Intra-op Plan:   Post-operative Plan: Extubation in OR  Informed Consent: I have reviewed the patients History and Physical, chart, labs and discussed the procedure including the risks, benefits and alternatives for the proposed anesthesia with the patient or authorized representative who has indicated his/her  understanding and acceptance.     Dental advisory given  Plan Discussed with: CRNA  Anesthesia Plan Comments:          Anesthesia Quick Evaluation

## 2024-03-18 NOTE — Anesthesia Postprocedure Evaluation (Signed)
 Anesthesia Post Note  Patient: George Austin  Procedure(s) Performed: PROSTATECTOMY, RADICAL, ROBOT-ASSISTED, LAPAROSCOPIC LYMPHADENECTOMY, PELVIS, ROBOT-ASSISTED (Bilateral)     Patient location during evaluation: PACU Anesthesia Type: General Level of consciousness: awake and alert, oriented and patient cooperative Pain management: pain level controlled Vital Signs Assessment: post-procedure vital signs reviewed and stable Respiratory status: spontaneous breathing, nonlabored ventilation and respiratory function stable Cardiovascular status: blood pressure returned to baseline and stable Postop Assessment: no apparent nausea or vomiting Anesthetic complications: no   No notable events documented.  Last Vitals:  Vitals:   03/18/24 1415 03/18/24 1430  BP: (!) 160/94 (!) 153/95  Pulse: 90 88  Resp: 17 16  Temp:    SpO2: 100% 100%    Last Pain:  Vitals:   03/18/24 1430  TempSrc:   PainSc: 0-No pain                 Almarie CHRISTELLA Marchi

## 2024-03-18 NOTE — Transfer of Care (Signed)
 Immediate Anesthesia Transfer of Care Note  Patient: George Austin  Procedure(s) Performed: PROSTATECTOMY, RADICAL, ROBOT-ASSISTED, LAPAROSCOPIC LYMPHADENECTOMY, PELVIS, ROBOT-ASSISTED (Bilateral)  Patient Location: PACU  Anesthesia Type:General  Level of Consciousness: sedated  Airway & Oxygen Therapy: Patient Spontanous Breathing and Patient connected to face mask oxygen  Post-op Assessment: Report given to RN and Post -op Vital signs reviewed and stable  Post vital signs: Reviewed and stable  Last Vitals:  Vitals Value Taken Time  BP    Temp    Pulse    Resp 15 03/18/24 13:13  SpO2    Vitals shown include unfiled device data.  Last Pain:  Vitals:   03/18/24 0613  TempSrc:   PainSc: 0-No pain         Complications: No notable events documented.

## 2024-03-18 NOTE — Discharge Instructions (Addendum)
 Discharge Instructions After Prostatectomy   Activity:  You are encouraged to ambulate frequently (about every hour during waking hours) to help prevent blood clots from forming in your legs or lungs.  However, you should not engage in any heavy lifting (> 10-15 lbs), strenuous activity, or straining. Diet: You should continue a clear liquid diet until passing gas from below.  Once this occurs, you may advance your diet to a soft diet that would be easy to digest (i.e soups, scrambled eggs, mashed potatoes, etc.) for 24 hours just as you would if getting over a bad stomach flu.  If tolerating this diet well for 24 hours, you may then begin eating regular food.  It will be normal to have some amount of bloating, nausea, and abdominal discomfort intermittently. Prescriptions:  You will be provided a prescription for pain medication to take as needed.  If your pain is not severe enough to require the prescription pain medication, you may take Tylenol  instead.  You should also take an over the counter stool softener (Colace 100 mg twice daily) to avoid straining with bowel movements as the pain medication may constipate you. Finally, you will also be provided a prescription for an antibiotic to begin the day prior to your return visit in the office for catheter removal. Catheter care: You will be taught how to take care of the catheter by the nursing staff prior to discharge from the hospital.  You may use both a leg bag and the larger bedside bag but it is recommended to at least use the bigger bedside bag at nighttime as the leg bag is small and will fill up overnight and also does not drain as well when lying flat. You may periodically feel a strong urge to void with the catheter in place.  This is a bladder spasm and most often can occur when having a bowel movement or when you are moving around. It is typically self-limited and usually will stop after a few minutes.  You may use some Vaseline or Neosporin  around the tip of the catheter to reduce friction at the tip of the penis. Incisions: You may remove your dressing bandages the 2nd day after surgery.  You most likely will have a few small staples in each of the incisions and once the bandages are removed, the incisions may stay open to air.  You may start showering (not soaking or bathing in water) 48 hours after surgery and the incisions simply need to be patted dry after the shower.  No additional care is needed. What to call us  about: You should call the office [phone]) if you develop fever > 101, persistent vomiting, or the catheter stops draining. Also, feel free to call with any other questions you may have and remember the handout that was provided to you as a reference preoperatively which answers many of the common questions that arise after surgery. You may resume aspirin, advil, aleve, vitamins, and supplements 7 days after surgery.

## 2024-03-18 NOTE — Progress Notes (Signed)
 Night of surgery note  Subjective: The patient is doing well.  No complaints.  Objective: Vital signs in last 24 hours: Temp:  [95.1 F (35.1 C)-98.5 F (36.9 C)] 97.7 F (36.5 C) (07/24 1558) Pulse Rate:  [71-98] 98 (07/24 1558) Resp:  [10-20] 20 (07/24 1558) BP: (110-160)/(65-95) 157/90 (07/24 1558) SpO2:  [99 %-100 %] 100 % (07/24 1558) Weight:  [52.2 kg] 52.2 kg (07/24 0613)  Intake/Output this shift: Total I/O In: 1400 [I.V.:1400] Out: 460 [Urine:300; Drains:50; Blood:110]  Physical Exam:  General: Alert and oriented. Abdomen: Soft, Distended (this is baseline)  Incision: Clean with dry, intact dressing.  Lab Results: Recent Labs    03/18/24 1358  HGB 8.8*  HCT 28.0*    Assessment/Plan: 1) Continue to monitor 2) Per orders  Steffan Pea MD.   Steffan JAYSON Pea 03/18/2024, 6:19 PM

## 2024-03-19 ENCOUNTER — Other Ambulatory Visit: Payer: Self-pay

## 2024-03-19 ENCOUNTER — Encounter (HOSPITAL_COMMUNITY): Payer: Self-pay | Admitting: Urology

## 2024-03-19 DIAGNOSIS — C61 Malignant neoplasm of prostate: Secondary | ICD-10-CM | POA: Diagnosis not present

## 2024-03-19 LAB — CBC
HCT: 34.3 % — ABNORMAL LOW (ref 39.0–52.0)
Hemoglobin: 10.9 g/dL — ABNORMAL LOW (ref 13.0–17.0)
MCH: 29.5 pg (ref 26.0–34.0)
MCHC: 31.8 g/dL (ref 30.0–36.0)
MCV: 92.7 fL (ref 80.0–100.0)
Platelets: 323 K/uL (ref 150–400)
RBC: 3.7 MIL/uL — ABNORMAL LOW (ref 4.22–5.81)
RDW: 14.7 % (ref 11.5–15.5)
WBC: 11.6 K/uL — ABNORMAL HIGH (ref 4.0–10.5)
nRBC: 0 % (ref 0.0–0.2)

## 2024-03-19 LAB — BASIC METABOLIC PANEL WITH GFR
Anion gap: 11 (ref 5–15)
BUN: 10 mg/dL (ref 8–23)
CO2: 22 mmol/L (ref 22–32)
Calcium: 8.5 mg/dL — ABNORMAL LOW (ref 8.9–10.3)
Chloride: 100 mmol/L (ref 98–111)
Creatinine, Ser: 1.27 mg/dL — ABNORMAL HIGH (ref 0.61–1.24)
GFR, Estimated: >60 mL/min (ref >60)
Glucose, Bld: 105 mg/dL — ABNORMAL HIGH (ref 70–99)
Potassium: 4.3 mmol/L (ref 3.5–5.1)
Sodium: 133 mmol/L — ABNORMAL LOW (ref 135–145)

## 2024-03-19 LAB — CREATININE, FLUID (PLEURAL, PERITONEAL, JP DRAINAGE): Creat, Fluid: 1.3 mg/dL

## 2024-03-19 MED ORDER — HEPARIN SODIUM (PORCINE) 5000 UNIT/ML IJ SOLN
5000.0000 [IU] | Freq: Three times a day (TID) | INTRAMUSCULAR | Status: DC
  Administered 2024-03-19: 5000 [IU] via SUBCUTANEOUS
  Filled 2024-03-19: qty 1

## 2024-03-19 NOTE — TOC Transition Note (Signed)
 Transition of Care [facility]) - Discharge Note   Patient Details  Name: George Austin MRN: [id number] Date of Birth: Jun 17, 1956  Transition of Care [facility]) CM/SW Contact:  Tawni CHRISTELLA Eva, LCSW Phone Number: 03/19/2024, 3:32 PM   Clinical Narrative:     CSW spoke with pt to discuss Medicare notice. Pt reports his sister will provided transportation upon d/c. Pt requesting a cane upon d/c. CSW sent referral to [employer] , cane to be delivered to pt's room prior to d/c . Care management sign off.   Final next level of care: Home/Self Care Barriers to Discharge: ED DME delivery   Patient Goals and CMS Choice Patient states their goals for this hospitalization and ongoing recovery are:: retrun home CMS Medicare.gov Compare Post Acute Care list provided to:: Patient Choice offered to / list presented to : Patient      Discharge Placement                    Patient and family notified of of transfer: 03/19/24  Discharge Plan and Services Additional resources added to the After Visit Summary for                  DME Arranged: [place] DME Agency: [employer] Date DME Agency Contacted: 03/19/24 Time DME Agency Contacted: 1531              Social Drivers of Health (SDOH) Interventions SDOH Screenings   Food Insecurity: No Food Insecurity (03/18/2024)  Housing: Low Risk  (03/18/2024)  Transportation Needs: No Transportation Needs (03/18/2024)  Utilities: Not At Risk (03/18/2024)  Alcohol Screen: Medium Risk (08/29/2023)  Depression (PHQ2-9): Low Risk  (06/23/2023)  Financial Resource Strain: Low Risk  (08/29/2023)  Physical Activity: Unknown (08/29/2023)  Social Connections: Moderately Integrated (03/18/2024)  Stress: No Stress Concern Present (08/29/2023)  Tobacco Use: High Risk (03/18/2024)     Readmission Risk Interventions     No data to display

## 2024-03-19 NOTE — Progress Notes (Signed)
 Discharge instructions given to patient and sister, including foley care and foley/leg bag instruction.  Questions asked and answered, with return demonstration from patient switching foley  to leg bag. JONETTA Ryder RN

## 2024-03-19 NOTE — Discharge Summary (Signed)
 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   Alcohol abuse 06/18/2022   Laryngitis, chronic 06/13/2022   Proteinuria 06/13/2022   Seborrheic dermatitis 04/30/2022   Hyperlipidemia LDL goal <130 09/16/2021   Encounter for general adult medical examination with abnormal findings 09/11/2021   Stage 3b chronic kidney disease (HCC) 09/11/2021   Need for vaccination 09/11/2021   PSA elevation 09/11/2021   Vitamin D  deficiency 01/21/2019   HTN (hypertension) 05/03/2013   Tobacco use disorder 05/03/2013    Procedures performed: Procedure(s): PROSTATECTOMY, RADICAL, ROBOT-ASSISTED, LAPAROSCOPIC LYMPHADENECTOMY, PELVIS, ROBOT-ASSISTED  History and Physical: For full details, please see admission history and physical. Briefly, George Austin is a 68 y.o. year old patient with Prostate cancer admitted for radical prostatectomy.   Hospital Course: Patient tolerated the procedure well.  He was then transferred to the floor after an uneventful PACU stay.  His hospital course was uncomplicated.  On POD#1 he had met discharge criteria: was eating a regular diet, was up and ambulating independently,  pain was well controlled, his labs were WNL and patient was started on heparin  and was ready to for discharge.  While admitted pt was kept on CIWA protocol   Fluid creatinine was obtained from JP drain before removal    Laboratory  values:  Recent Labs    03/18/24 1358 03/19/24 0355  WBC  --  11.6*  HGB 8.8* 10.9*  HCT 28.0* 34.3*   No results for input(s): NA, K, CL, CO2, GLUCOSE, BUN, CREATININE, CALCIUM  in the last 72 hours. No results for input(s): LABPT, INR in the last 72 hours. No results for input(s): LABURIN in the last 72 hours. Results for orders placed or performed during the hospital encounter of 03/09/24  Urine Culture     Status: None   Collection Time: 03/09/24  8:56 AM   Specimen: Urine, Clean Catch  Result Value Ref Range Status   Specimen Description   Final    URINE, CLEAN CATCH Performed at [facility], [address], [place], [place] [address]    Special Requests   Final    NONE Performed at [facility], [address], [place], [place] [address]    Culture   Final    NO GROWTH Performed at [facility], [address] [place], [place] [address]    Report Status 03/10/2024 FINAL  Final    Disposition: Home  Discharge instruction: The patient was instructed to be ambulatory but told to refrain from heavy lifting, strenuous activity, or driving.   Discharge medications:  See Discharge instructions    Followup:   Follow-up Information     Claudene Waddell HERO, PA-C Follow up on 04/01/2024.   Why: YOu will be called to set up a catheter removal in 2 weeks Contact information: [address] [place] [address] [phone]         Shane Steffan BROCKS, MD Follow up on 04/09/2024.   Specialty:  Urology Why: 8 AM for pathology review Contact information: [address] [place] [place] [address] [phone]

## 2024-03-19 NOTE — Progress Notes (Signed)
 Patient was waiting to get cane delivered to patient's room prior to discharge. Patient had been waiting for a while and decided to cancel the order since patient's sister had an extra cane at home. Plus, the patient needed to get medications from designated pharmacy by 7:00 pm. Patient left the hospital stable, had discharge paperwork/instructions and supplies, and had all personal belongings.

## 2024-03-19 NOTE — Progress Notes (Addendum)
 1 Day Post-Op Subjective: Doing well, passing gas, has ambulated, pain well controlled, no N/V/. No infectious symptoms. Pt did cough and had some minor midline inciosn leakage, not leaking now and dressing is dry.   Objective: Vital signs in last 24 hours: Temp:  [95.1 F (35.1 C)-98.7 F (37.1 C)] 98.7 F (37.1 C) (07/25 0425) Pulse Rate:  [71-98] 91 (07/25 0425) Resp:  [10-20] 16 (07/25 0425) BP: (110-160)/(65-95) 135/74 (07/25 0425) SpO2:  [99 %-100 %] 100 % (07/25 0425) Weight:  [52.2 kg] 52.2 kg (07/24 9386)  Intake/Output from previous day: 07/24 0701 - 07/25 0700 In: 2443 [P.O.:240; I.V.:2203] Out: 2810 [Urine:2550; Drains:150; Blood:110] Intake/Output this shift: Total I/O In: 1043 [P.O.:240; I.V.:803] Out: 1310 [Urine:1250; Drains:60]  Physical Exam:  General: Alert and oriented CV: RRR Lungs: Clear Abdomen: Soft, , ATTP; inc c/d/I, slightly distended this is his baseline prior to surgery. Midline incision is dry no bulging no concern for hernia. JP serosanguinous  GU: catheter in place draining clear yellow urine.  Ext: NT, No erythema  Lab Results: Recent Labs    03/18/24 1358 03/19/24 0355  HGB 8.8* 10.9*  HCT 28.0* 34.3*   BMET No results for input(s): NA, K, CL, CO2, GLUCOSE, BUN, CREATININE, CALCIUM  in the last 72 hours.   Studies/Results: No results found.  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

## 2024-03-19 NOTE — Care Management Obs Status (Signed)
 MEDICARE OBSERVATION STATUS NOTIFICATION   Patient Details  Name: George Austin MRN: [id number] Date of Birth: 07/04/1956   Medicare Observation Status Notification Given:  Yes    Tawni CHRISTELLA Eva, LCSW 03/19/2024, 3:11 PM

## 2024-03-19 NOTE — Plan of Care (Signed)
  Problem: Education: Goal: Knowledge of the procedure and recovery process will improve Outcome: Progressing   

## 2024-03-23 LAB — SURGICAL PATHOLOGY

## 2024-03-29 ENCOUNTER — Ambulatory Visit (INDEPENDENT_AMBULATORY_CARE_PROVIDER_SITE_OTHER)

## 2024-03-29 VITALS — Ht 67.0 in | Wt 115.0 lb

## 2024-03-29 DIAGNOSIS — Z0000 Encounter for general adult medical examination without abnormal findings: Secondary | ICD-10-CM

## 2024-03-29 NOTE — Patient Instructions (Signed)
 Mr. Simonian , Thank you for taking time out of your busy schedule to complete your Annual Wellness Visit with me. I enjoyed our conversation and look forward to speaking with you again next year. I, as well as your care team,  appreciate your ongoing commitment to your health goals. Please review the following plan we discussed and let me know if I can assist you in the future. Your Game plan/ To Do List      Follow up Visits: We will see or speak with you next year for your Next Medicare AWV with our clinical staff Have you seen your provider in the last 6 months (3 months if uncontrolled diabetes)? No  Clinician Recommendations:  Aim for 30 minutes of exercise or brisk walking, 6-8 glasses of water, and 5 servings of fruits and vegetables each day. You are due for a a Hep B vaccine and can get that done during your next office visit.  Get well soon.        This is a list of the screenings recommended for you:  Health Maintenance  Topic Date Due   Medicare Annual Wellness Visit  Never done   COVID-19 Vaccine (1) Never done   Hepatitis B Vaccine (2 of 2 - CpG 2-dose series) 01/09/2022   Flu Shot  03/26/2024   Colon Cancer Screening  09/12/2024   DTaP/Tdap/Td vaccine (2 - Td or Tdap) 12/16/2032   Pneumococcal Vaccine for age over 40  Completed   Hepatitis C Screening  Completed   HIV Screening  Completed   Zoster (Shingles) Vaccine  Completed   HPV Vaccine  Aged Out   Meningitis B Vaccine  Aged Out    Advanced directives: (Copy Requested) Please bring a copy of your health care power of attorney and living will to the office to be added to your chart at your convenience. You can mail to [facility] [address] [place], [place] [address] or email to [email or web address] Advance Care Planning is important because it:  [x]  Makes sure you receive the medical care that is consistent with your values, goals, and preferences  [x]  It provides guidance to your  family and loved ones and reduces their decisional burden about whether or not they are making the right decisions based on your wishes.  Follow the link provided in your after visit summary or read over the paperwork we have mailed to you to help you started getting your Advance Directives in place. If you need assistance in completing these, please reach out to us  so that we can help you!  See attachments for Preventive Care and Fall Prevention Tips.

## 2024-03-29 NOTE — Progress Notes (Signed)
 Subjective:   George Austin is a 68 y.o. who presents for a Medicare Wellness preventive visit.  As a reminder, Annual Wellness Visits don't include a physical exam, and some assessments may be limited, especially if this visit is performed virtually. We may recommend an in-person follow-up visit with your provider if needed.  Visit Complete: Virtual I connected with  George Austin on 03/29/24 by a video and audio enabled telemedicine application and verified that I am speaking with the correct person using two identifiers.  Patient Location: Home  Provider Location: Home Office  I discussed the limitations of evaluation and management by telemedicine. The patient expressed understanding and agreed to proceed.  Vital Signs: Because this visit was a virtual/telehealth visit, some criteria may be missing or patient reported. Any vitals not documented were not able to be obtained and vitals that have been documented are patient reported.  Persons Participating in Visit: Patient.  AWV Questionnaire: Yes: Patient Medicare AWV questionnaire was completed by the patient on 03/25/2024; I have confirmed that all information answered by patient is correct and no changes since this date.  Cardiac Risk Factors include: male gender;advanced age (>85men, >43 women);hypertension;Other (see comment);dyslipidemia, Risk factor comments: CKd stage 3b     Objective:    Today's Vitals   03/25/24 0902 03/29/24 1043  Weight:  115 lb (52.2 kg)  Height:  5' 7 (1.702 m)  PainSc: 3     Body mass index is 18.01 kg/m.     03/29/2024   10:55 AM 03/19/2024    2:28 AM 03/09/2024    9:08 AM 07/17/2021    8:59 AM 02/15/2015   11:31 AM 01/18/2015    9:25 AM  Advanced Directives  Does Patient Have a Medical Advance Directive? Yes No Yes No No  No   Type of Estate agent of [place];Living will  Healthcare Power of Attorney     Does patient want to make changes to medical advance  directive?   No - Patient declined     Copy of Healthcare Power of Attorney in Chart? No - copy requested       Would patient like information on creating a medical advance directive? No - Patient declined Yes (Inpatient - patient requests chaplain consult to create a medical advance directive)   No - patient declined information  Yes - Educational materials given      Data saved with a previous flowsheet row definition    Current Medications (verified) Outpatient Encounter Medications as of 03/29/2024  Medication Sig   amLODipine  (NORVASC ) 5 MG tablet Take 5 mg by mouth daily.   azelastine  (ASTELIN ) 0.1 % nasal spray Place 1 spray into both nostrils 2 (two) times daily. Use in each nostril as directed (Patient taking differently: Place 1 spray into both nostrils 2 (two) times daily as needed for rhinitis. Use in each nostril as directed)   Cholecalciferol (VITAMIN D3 MAXIMUM STRENGTH) 125 MCG (5000 UT) capsule Take 5,000 Units by mouth every other day.   docusate sodium  (COLACE) 100 MG capsule Take 1 capsule (100 mg total) by mouth 2 (two) times daily.   folic acid  (FOLVITE ) 1 MG tablet Take 1 tablet (1 mg total) by mouth daily.   HYDROcodone -acetaminophen  (NORCO/VICODIN) 5-325 MG tablet Take 1-2 tablets by mouth every 6 (six) hours as needed for moderate pain (pain score 4-6) or severe pain (pain score 7-10).   ketoconazole  (NIZORAL ) 2 % shampoo Apply 1 Application topically 2 (two) times a  week.   magnesium gluconate (MAGONATE) 500 (27 Mg) MG TABS tablet Take 500 mg by mouth daily.   Multiple Vitamin (MULTIVITAMIN WITH MINERALS) TABS tablet Take 1 tablet by mouth daily.   polyethylene glycol (MIRALAX ) 17 g packet Take 17 g by mouth daily.   rosuvastatin  (CRESTOR ) 10 MG tablet TAKE 1 TABLET BY MOUTH EVERY DAY   sulfamethoxazole -trimethoprim  (BACTRIM  DS) 800-160 MG tablet Take 1 tablet by mouth 2 (two) times daily. Start the day prior to foley removal appointment   olmesartan (BENICAR) 5 MG  tablet Take 5 mg by mouth daily. (Patient not taking: Reported on 03/29/2024)   thiamine  (VITAMIN B1) 100 MG tablet Take 1 tablet (100 mg total) by mouth daily. (Patient taking differently: Take 100 mg by mouth every other day.)   No facility-administered encounter medications on file as of 03/29/2024.    Allergies (verified) Patient has no known allergies.   History: Past Medical History:  Diagnosis Date   Allergy    Arthritis    Cancer (HCC)    prostate   Complication of anesthesia    Hard to wake up- more than 20 years ago.   Elevated serum creatinine 07/2019   Hyperlipidemia 07/2019   Hypertension    Hyperthyroidism    Vitamin D  deficiency 07/2019   Past Surgical History:  Procedure Laterality Date   COLONOSCOPY     over 10 yrs ago in [place], normal per patient 08/30/19   CYST EXCISION Left    thigh   MULTIPLE TOOTH EXTRACTIONS     ROBOT ASSISTED LAPAROSCOPIC RADICAL PROSTATECTOMY N/A 03/18/2024   Procedure: PROSTATECTOMY, RADICAL, ROBOT-ASSISTED, LAPAROSCOPIC;  Surgeon: Shane Steffan BROCKS, MD;  Location: [facility];  Service: Urology;  Laterality: N/A;   WISDOM TOOTH EXTRACTION     Family History  Problem Relation Age of Onset   Diabetes Mother    Stomach cancer Mother    Hypertension Father    Stroke Sister    Lupus Sister    Stroke Brother    Diabetes Brother    Colon cancer Neg Hx    Rectal cancer Neg Hx    Esophageal cancer Neg Hx    Social History   Socioeconomic History   Marital status: Single    Spouse name: Not on file   Number of children: Not on file   Years of education: Not on file   Highest education level: Bachelor's degree (e.g., BA, AB, BS)  Occupational History   Occupation: RETIRED  Tobacco Use   Smoking status: Every Day    Current packs/day: 0.25    Average packs/day: 0.3 packs/day for 40.0 years (10.0 ttl pk-yrs)    Types: Cigarettes   Smokeless tobacco: Never   Tobacco comments:    smoking .25 - 0.33 ppd  Vaping Use   Vaping  status: Never Used  Substance and Sexual Activity   Alcohol use: Yes    Alcohol/week: 10.0 standard drinks of alcohol    Types: 10 Shots of liquor per week    Comment: week   Drug use: No   Sexual activity: Yes    Partners: Female  Other Topics Concern   Not on file  Social History Narrative   Left handed    Lives alone/2025   Social Drivers of Health   Financial Resource Strain: Medium Risk (03/25/2024)   Overall Financial Resource Strain (CARDIA)    Difficulty of Paying Living Expenses: Somewhat hard  Food Insecurity: Patient Declined (03/25/2024)   Hunger Vital Sign  Worried About Programme researcher, broadcasting/film/video in the Last Year: Patient declined    Barista in the Last Year: Patient declined  Transportation Needs: No Transportation Needs (03/25/2024)   PRAPARE - Administrator, Civil Service (Medical): No    Lack of Transportation (Non-Medical): No  Physical Activity: Inactive (03/25/2024)   Exercise Vital Sign    Days of Exercise per Week: 0 days    Minutes of Exercise per Session: Not on file  Stress: No Stress Concern Present (03/25/2024)   [employer] of Occupational Health - Occupational Stress Questionnaire    Feeling of Stress: Not at all  Social Connections: Socially Isolated (03/25/2024)   Social Connection and Isolation Panel    Frequency of Communication with Friends and Family: Once a week    Frequency of Social Gatherings with Friends and Family: Once a week    Attends Religious Services: Patient declined    Database administrator or Organizations: No    Attends Engineer, structural: Not on file    Marital Status: Never married    Tobacco Counseling Ready to quit: Not Answered Counseling given: Not Answered Tobacco comments: smoking .25 - 0.33 ppd    Clinical Intake:  Pre-visit preparation completed: Yes  Pain : 0-10 (due to sugery on 03/18/24-per pt) Pain Score: 3  (due to sugery on 03/18/24-per pt) Pain Type: Acute pain Pain  Descriptors / Indicators: Discomfort, Aching Pain Onset: 1 to 4 weeks ago Pain Frequency: Intermittent Pain Relieving Factors: Methocarbamol  750 mg/ Hydrocodone - act 325 mg/ Effect of Pain on Daily Activities: Prostate surgery  Pain Relieving Factors: Methocarbamol  750 mg/ Hydrocodone - act 325 mg/  BMI - recorded: 18.01 Nutritional Status: BMI <19  Underweight Nutritional Risks: None Diabetes: No  Lab Results  Component Value Date   HGBA1C 5.0 07/28/2019   HGBA1C 5.4 07/17/2018   HGBA1C 5.3 07/08/2017     How often do you need to have someone help you when you read instructions, pamphlets, or other written materials from your doctor or pharmacy?: 1 - Never  Interpreter Needed?: No  Information entered by :: Yliana Gravois, RMA   Activities of Daily Living     03/25/2024    9:02 AM 03/18/2024    6:50 PM  In your present state of health, do you have any difficulty performing the following activities:  Hearing? 0   Vision? 0   Difficulty concentrating or making decisions? 0   Walking or climbing stairs? 0   Dressing or bathing? 0   Doing errands, shopping? 0 0  Comment not for 2 weeks due to surgery   Preparing Food and eating ? Y   Using the Toilet? N   In the past six months, have you accidently leaked urine? N   Do you have problems with loss of bowel control? N   Managing your Medications? N   Managing your Finances? N   Housekeeping or managing your Housekeeping? Y     Patient Care Team: Joshua Debby CROME, MD as PCP - General (Internal Medicine) Georjean Darice HERO, MD as Consulting Physician (Neurology)  I have updated your Care Teams any recent Medical Services you may have received from other providers in the past year.     Assessment:   This is a routine wellness examination for George Austin.  Hearing/Vision screen Hearing Screening - Comments:: Denies hearing difficulties   Vision Screening - Comments:: Wears eyeglasses/[facility]   Goals Addressed   None  Depression Screen     03/29/2024   10:56 AM 06/23/2023    8:47 AM 04/30/2022    9:47 AM 05/03/2021    4:18 PM 01/26/2020    9:08 AM 07/17/2018    2:15 PM 01/14/2018    2:42 PM  PHQ 2/9 Scores  PHQ - 2 Score 1 0 0 0 0 0 0  PHQ- 9 Score 3          Fall Risk     03/25/2024    9:02 AM 06/23/2023    8:47 AM 04/30/2022    9:46 AM 07/17/2021    8:59 AM 07/02/2021    2:42 PM  Fall Risk   Falls in the past year? 0 0 0 1 1  Number falls in past yr: 0 0 0 1 0  Injury with Fall? 0 0 0 0 0  Risk for fall due to :  No Fall Risks No Fall Risks    Follow up Falls evaluation completed;Falls prevention discussed Falls evaluation completed Falls evaluation completed        Data saved with a previous flowsheet row definition    MEDICARE RISK AT HOME:  Medicare Risk at Home Any stairs in or around the home?: (Patient-Rptd) Yes If so, are there any without handrails?: (Patient-Rptd) No Home free of loose throw rugs in walkways, pet beds, electrical cords, etc?: (Patient-Rptd) Yes Adequate lighting in your home to reduce risk of falls?: (Patient-Rptd) Yes Life alert?: (Patient-Rptd) No Use of a cane, walker or w/c?: (Patient-Rptd) No Grab bars in the bathroom?: (Patient-Rptd) No Shower chair or bench in shower?: (Patient-Rptd) No Elevated toilet seat or a handicapped toilet?: (Patient-Rptd) Yes  TIMED UP AND GO:  Was the test performed?  No  Cognitive Function: Declined/Normal: No cognitive concerns noted by patient or family. Patient alert, oriented, able to answer questions appropriately and recall recent events. No signs of memory loss or confusion.        Immunizations Immunization History  Administered Date(s) Administered   Fluad Trivalent(High Dose 65+) 06/23/2023   Hepb-cpg 12/12/2021   Influenza,inj,Quad PF,6+ Mos 06/13/2022   PNEUMOCOCCAL CONJUGATE-20 06/23/2023   Tdap 12/17/2022   Zoster Recombinant(Shingrix ) 09/11/2021, 12/12/2021    Screening Tests Health Maintenance   Topic Date Due   Medicare Annual Wellness (AWV)  Never done   COVID-19 Vaccine (1) Never done   Hepatitis B Vaccines (2 of 2 - CpG 2-dose series) 01/09/2022   INFLUENZA VACCINE  03/26/2024   Colonoscopy  09/12/2024   DTaP/Tdap/Td (2 - Td or Tdap) 12/16/2032   Pneumococcal Vaccine: 50+ Years  Completed   Hepatitis C Screening  Completed   HIV Screening  Completed   Zoster Vaccines- Shingrix   Completed   HPV VACCINES  Aged Out   Meningococcal B Vaccine  Aged Out    Health Maintenance  Health Maintenance Due  Topic Date Due   Medicare Annual Wellness (AWV)  Never done   COVID-19 Vaccine (1) Never done   Hepatitis B Vaccines (2 of 2 - CpG 2-dose series) 01/09/2022   INFLUENZA VACCINE  03/26/2024   Health Maintenance Items Addressed: Labs Ordered: Hep B vaccine due, See Nurse Notes at the end of this note  Additional Screening:  Vision Screening: Recommended annual ophthalmology exams for early detection of glaucoma and other disorders of the eye. Would you like a referral to an eye doctor? No    Dental Screening: Recommended annual dental exams for proper oral hygiene  Community Resource Referral / Chronic Care  Management: CRR required this visit?  No   CCM required this visit?  No   Plan:    I have personally reviewed and noted the following in the patient's chart:   Medical and social history Use of alcohol, tobacco or illicit drugs  Current medications and supplements including opioid prescriptions. Patient is currently taking opioid prescriptions. Information provided to patient regarding non-opioid alternatives. Patient advised to discuss non-opioid treatment plan with their provider. Functional ability and status Nutritional status Physical activity Advanced directives List of other physicians Hospitalizations, surgeries, and ER visits in previous 12 months Vitals Screenings to include cognitive, depression, and falls Referrals and appointments  In  addition, I have reviewed and discussed with patient certain preventive protocols, quality metrics, and best practice recommendations. A written personalized care plan for preventive services as well as general preventive health recommendations were provided to patient.   George Austin, CMA   03/29/2024   After Visit Summary: (MyChart) Due to this being a telephonic visit, the after visit summary with patients personalized plan was offered to patient via MyChart   Notes: Patient is due for a Hep B vaccine and will have that done during his next office visit.  Patient is up to date on all other health maintenance with no concerns to address today.

## 2024-04-02 DIAGNOSIS — C61 Malignant neoplasm of prostate: Secondary | ICD-10-CM | POA: Diagnosis not present

## 2024-04-15 DIAGNOSIS — M6281 Muscle weakness (generalized): Secondary | ICD-10-CM | POA: Diagnosis not present

## 2024-04-15 DIAGNOSIS — N393 Stress incontinence (female) (male): Secondary | ICD-10-CM | POA: Diagnosis not present

## 2024-04-15 DIAGNOSIS — M62838 Other muscle spasm: Secondary | ICD-10-CM | POA: Diagnosis not present

## 2024-04-29 DIAGNOSIS — M62838 Other muscle spasm: Secondary | ICD-10-CM | POA: Diagnosis not present

## 2024-04-29 DIAGNOSIS — M6281 Muscle weakness (generalized): Secondary | ICD-10-CM | POA: Diagnosis not present

## 2024-04-29 DIAGNOSIS — N393 Stress incontinence (female) (male): Secondary | ICD-10-CM | POA: Diagnosis not present

## 2024-05-07 DIAGNOSIS — C61 Malignant neoplasm of prostate: Secondary | ICD-10-CM | POA: Diagnosis not present

## 2024-05-10 LAB — PSA: PSA: 0

## 2024-05-11 ENCOUNTER — Other Ambulatory Visit: Payer: Self-pay | Admitting: Internal Medicine

## 2024-05-11 DIAGNOSIS — E785 Hyperlipidemia, unspecified: Secondary | ICD-10-CM

## 2024-05-14 ENCOUNTER — Other Ambulatory Visit: Payer: Self-pay | Admitting: Internal Medicine

## 2024-05-14 DIAGNOSIS — E785 Hyperlipidemia, unspecified: Secondary | ICD-10-CM

## 2024-05-14 NOTE — Telephone Encounter (Signed)
 Copied from CRM [id number]. Topic: Clinical - Medication Refill >> May 14, 2024  9:24 AM Carlatta H wrote: Medication: rosuvastatin  (CRESTOR ) 10 MG tablet  Has the patient contacted their pharmacy? Yes (Agent: If no, request that the patient contact the pharmacy for the refill. If patient does not wish to contact the pharmacy document the reason why and proceed with request.) (Agent: If yes, when and what did the pharmacy advise?)contact office  This is the patient's preferred pharmacy:  [id number] [place], [place] - [address] [place] [address] Phone: [phone] Fax: [phone]  Is this the correct pharmacy for this prescription? Yes If no, delete pharmacy and type the correct one.   Has the prescription been filled recently? No  Is the patient out of the medication? No  Has the patient been seen for an appointment in the last year OR does the patient have an upcoming appointment? Yes  Can we respond through MyChart? Yes  Agent: Please be advised that Rx refills may take up to 3 business days. We ask that you follow-up with your pharmacy.

## 2024-05-15 ENCOUNTER — Other Ambulatory Visit: Payer: Self-pay | Admitting: Internal Medicine

## 2024-05-15 DIAGNOSIS — D531 Other megaloblastic anemias, not elsewhere classified: Secondary | ICD-10-CM

## 2024-05-17 DIAGNOSIS — N393 Stress incontinence (female) (male): Secondary | ICD-10-CM | POA: Diagnosis not present

## 2024-05-17 DIAGNOSIS — M62838 Other muscle spasm: Secondary | ICD-10-CM | POA: Diagnosis not present

## 2024-05-17 DIAGNOSIS — M6281 Muscle weakness (generalized): Secondary | ICD-10-CM | POA: Diagnosis not present

## 2024-05-17 DIAGNOSIS — C61 Malignant neoplasm of prostate: Secondary | ICD-10-CM | POA: Diagnosis not present

## 2024-05-19 ENCOUNTER — Encounter: Payer: Self-pay | Admitting: *Deleted

## 2024-05-19 NOTE — Progress Notes (Signed)
 George Austin                                          MRN: [id number]   05/19/2024   The [facility] Quality Team Specialist reviewed this patient medical record for the purposes of chart review for care gap closure. The following were reviewed: abstraction for care gap closure-controlling blood pressure.    [facility] Quality Team

## 2024-05-26 ENCOUNTER — Other Ambulatory Visit: Payer: Self-pay | Admitting: Internal Medicine

## 2024-05-26 DIAGNOSIS — E785 Hyperlipidemia, unspecified: Secondary | ICD-10-CM

## 2024-06-07 ENCOUNTER — Encounter: Payer: Self-pay | Admitting: Internal Medicine

## 2024-06-07 ENCOUNTER — Ambulatory Visit (INDEPENDENT_AMBULATORY_CARE_PROVIDER_SITE_OTHER): Admitting: Internal Medicine

## 2024-06-07 VITALS — BP 118/60 | HR 105 | Temp 98.5°F | Resp 18 | Wt 106.8 lb

## 2024-06-07 DIAGNOSIS — D531 Other megaloblastic anemias, not elsewhere classified: Secondary | ICD-10-CM | POA: Diagnosis not present

## 2024-06-07 DIAGNOSIS — N1832 Chronic kidney disease, stage 3b: Secondary | ICD-10-CM

## 2024-06-07 DIAGNOSIS — Z23 Encounter for immunization: Secondary | ICD-10-CM

## 2024-06-07 DIAGNOSIS — R801 Persistent proteinuria, unspecified: Secondary | ICD-10-CM

## 2024-06-07 DIAGNOSIS — J431 Panlobular emphysema: Secondary | ICD-10-CM | POA: Diagnosis not present

## 2024-06-07 DIAGNOSIS — C61 Malignant neoplasm of prostate: Secondary | ICD-10-CM

## 2024-06-07 DIAGNOSIS — F1010 Alcohol abuse, uncomplicated: Secondary | ICD-10-CM

## 2024-06-07 DIAGNOSIS — N184 Chronic kidney disease, stage 4 (severe): Secondary | ICD-10-CM

## 2024-06-07 DIAGNOSIS — E785 Hyperlipidemia, unspecified: Secondary | ICD-10-CM | POA: Diagnosis not present

## 2024-06-07 DIAGNOSIS — D511 Vitamin B12 deficiency anemia due to selective vitamin B12 malabsorption with proteinuria: Secondary | ICD-10-CM | POA: Diagnosis not present

## 2024-06-07 DIAGNOSIS — I10 Essential (primary) hypertension: Secondary | ICD-10-CM

## 2024-06-07 LAB — HEPATIC FUNCTION PANEL
ALT: 14 U/L (ref 0–53)
AST: 23 U/L (ref 0–37)
Albumin: 4.4 g/dL (ref 3.5–5.2)
Alkaline Phosphatase: 85 U/L (ref 39–117)
Bilirubin, Direct: 0.1 mg/dL (ref 0.0–0.3)
Total Bilirubin: 0.3 mg/dL (ref 0.2–1.2)
Total Protein: 7.8 g/dL (ref 6.0–8.3)

## 2024-06-07 LAB — BASIC METABOLIC PANEL WITH GFR
BUN: 10 mg/dL (ref 6–23)
CO2: 32 meq/L (ref 19–32)
Calcium: 9.8 mg/dL (ref 8.4–10.5)
Chloride: 98 meq/L (ref 96–112)
Creatinine, Ser: 1.82 mg/dL — ABNORMAL HIGH (ref 0.40–1.50)
GFR: 38.31 mL/min — ABNORMAL LOW (ref >60.00)
Glucose, Bld: 103 mg/dL — ABNORMAL HIGH (ref 70–99)
Potassium: 3.9 meq/L (ref 3.5–5.1)
Sodium: 138 meq/L (ref 135–145)

## 2024-06-07 LAB — CBC WITH DIFFERENTIAL/PLATELET
Basophils Absolute: 0.1 K/uL (ref 0.0–0.1)
Basophils Relative: 0.8 % (ref 0.0–3.0)
Eosinophils Absolute: 0.2 K/uL (ref 0.0–0.7)
Eosinophils Relative: 2.3 % (ref 0.0–5.0)
HCT: 37.3 % — ABNORMAL LOW (ref 39.0–52.0)
Hemoglobin: 12 g/dL — ABNORMAL LOW (ref 13.0–17.0)
Lymphocytes Relative: 35 % (ref 12.0–46.0)
Lymphs Abs: 2.6 K/uL (ref 0.7–4.0)
MCHC: 32.1 g/dL (ref 30.0–36.0)
MCV: 89.1 fl (ref 78.0–100.0)
Monocytes Absolute: 0.7 K/uL (ref 0.1–1.0)
Monocytes Relative: 9.8 % (ref 3.0–12.0)
Neutro Abs: 3.9 K/uL (ref 1.4–7.7)
Neutrophils Relative %: 52.1 % (ref 43.0–77.0)
Platelets: 311 K/uL (ref 150.0–400.0)
RBC: 4.19 Mil/uL — ABNORMAL LOW (ref 4.22–5.81)
RDW: 16.6 % — ABNORMAL HIGH (ref 11.5–15.5)
WBC: 7.5 K/uL (ref 4.0–10.5)

## 2024-06-07 LAB — LIPID PANEL
Cholesterol: 163 mg/dL (ref 0–200)
HDL: 66.6 mg/dL (ref >39.00)
LDL Cholesterol: 78 mg/dL (ref 0–99)
NonHDL: 96.83
Total CHOL/HDL Ratio: 2
Triglycerides: 94 mg/dL (ref 0.0–149.0)
VLDL: 18.8 mg/dL (ref 0.0–40.0)

## 2024-06-07 LAB — TSH: TSH: 0.9 u[IU]/mL (ref 0.35–5.50)

## 2024-06-07 LAB — PROTIME-INR
INR: 0.9 ratio (ref 0.8–1.0)
Prothrombin Time: 10 s (ref 9.6–13.1)

## 2024-06-07 LAB — FOLATE: Folate: >22.9 ng/mL (ref >5.9)

## 2024-06-07 MED ORDER — CYANOCOBALAMIN 1000 MCG/ML IJ SOLN
1000.0000 ug | Freq: Once | INTRAMUSCULAR | Status: AC
  Administered 2024-06-07: 1000 ug via INTRAMUSCULAR

## 2024-06-07 MED ORDER — VITAMIN B-1 50 MG PO TABS: 50.0000 mg | ORAL_TABLET | Freq: Every day | ORAL | 1 refills | Status: AC

## 2024-06-07 NOTE — Progress Notes (Unsigned)
 Subjective:  Patient ID: George Austin, male    DOB: September 22, 1955  Age: 68 y.o. MRN: [id number]  CC: Hyperlipidemia, Hypertension, and Anemia   HPI [name] presents for f/up ----  Discussed the use of AI scribe software for clinical note transcription with the patient, who gave verbal consent to proceed.  History of Present Illness George Austin is a 67 year old male who presents for follow-up after prostate cancer surgery.  He underwent prostate cancer surgery on March 18, 2024. Post-surgery, he experiences challenges with bladder control, describing it as 'trying to control my bladder and stuff like that.' No pain, hematuria, or dysuria. He was taking pain medication post-surgery but reports no chronic pain now.  He experiences occasional nighttime throat drainage with a 'gritty sensation,' but no odynophagia or dysphagia. He smokes approximately four cigarettes a day and consumes alcohol, with a small bottle lasting two to three days. He is attempting to quit both habits.  He has not received a B12 injection since the last visit but is taking a folate supplement. He has not had a flu shot recently and avoids the COVID vaccine due to a previous adverse reaction.  No dyspnea, fever, or chills. He feels his blood pressure is good for him.     Outpatient Medications Prior to Visit  Medication Sig Dispense Refill   amLODipine  (NORVASC ) 5 MG tablet Take 5 mg by mouth daily.     azelastine  (ASTELIN ) 0.1 % nasal spray Place 1 spray into both nostrils 2 (two) times daily. Use in each nostril as directed (Patient taking differently: Place 1 spray into both nostrils 2 (two) times daily as needed for rhinitis. Use in each nostril as directed) 90 mL 1   Cholecalciferol (VITAMIN D3 MAXIMUM STRENGTH) 125 MCG (5000 UT) capsule Take 5,000 Units by mouth every other day.     docusate sodium  (COLACE) 100 MG capsule Take 1 capsule (100 mg total) by mouth 2 (two) times daily.     folic acid   (FOLVITE ) 1 MG tablet TAKE 1 TABLET BY MOUTH EVERY DAY 90 tablet 1   ketoconazole  (NIZORAL ) 2 % shampoo Apply 1 Application topically 2 (two) times a week. 120 mL 0   magnesium gluconate (MAGONATE) 500 (27 Mg) MG TABS tablet Take 500 mg by mouth daily.     Multiple Vitamin (MULTIVITAMIN WITH MINERALS) TABS tablet Take 1 tablet by mouth daily.     olmesartan (BENICAR) 5 MG tablet Take 5 mg by mouth daily.     polyethylene glycol (MIRALAX ) 17 g packet Take 17 g by mouth daily. 14 each 0   rosuvastatin  (CRESTOR ) 10 MG tablet TAKE 1 TABLET BY MOUTH EVERY DAY 90 tablet 0   HYDROcodone -acetaminophen  (NORCO/VICODIN) 5-325 MG tablet Take 1-2 tablets by mouth every 6 (six) hours as needed for moderate pain (pain score 4-6) or severe pain (pain score 7-10). 20 tablet 0   sulfamethoxazole -trimethoprim  (BACTRIM  DS) 800-160 MG tablet Take 1 tablet by mouth 2 (two) times daily. Start the day prior to foley removal appointment 6 tablet 0   thiamine  (VITAMIN B1) 100 MG tablet Take 1 tablet (100 mg total) by mouth daily. (Patient taking differently: Take 100 mg by mouth every other day.) 90 tablet 1   No facility-administered medications prior to visit.    ROS Review of Systems  Objective:  BP 118/60 (BP Location: Left Arm, Patient Position: Sitting, Cuff Size: Normal)   Pulse (!) 105   Temp 98.5 F (36.9 C) (Oral)  Resp 18   Wt 106 lb 12.8 oz (48.4 kg)   SpO2 99%   BMI 16.73 kg/m   BP Readings from Last 3 Encounters:  06/07/24 118/60  03/19/24 123/68  03/09/24 121/73    Wt Readings from Last 3 Encounters:  06/07/24 106 lb 12.8 oz (48.4 kg)  03/29/24 115 lb (52.2 kg)  03/18/24 115 lb (52.2 kg)    Physical Exam Cardiovascular:     Rate and Rhythm: Normal rate and regular rhythm.     Comments: EKG- NSR, 98 bpm No LVH, Q waves, or ST/T wave changes  Improved  Musculoskeletal:     Right lower leg: No edema.     Left lower leg: No edema.     Lab Results  Component Value Date   WBC  7.5 06/07/2024   HGB 12.0 (L) 06/07/2024   HCT 37.3 (L) 06/07/2024   PLT 311.0 06/07/2024   GLUCOSE 103 (H) 06/07/2024   CHOL 163 06/07/2024   TRIG 94.0 06/07/2024   HDL 66.60 06/07/2024   LDLCALC 78 06/07/2024   ALT 14 06/07/2024   AST 23 06/07/2024   NA 138 06/07/2024   K 3.9 06/07/2024   CL 98 06/07/2024   CREATININE 1.82 (H) 06/07/2024   BUN 10 06/07/2024   CO2 32 06/07/2024   TSH 0.90 06/07/2024   PSA 13.95 (H) 09/01/2023   INR 0.9 06/07/2024   HGBA1C 5.0 07/28/2019    No results found.  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  Dispense: 90 tablet; Refill: 1  Persistent proteinuria -     Urinalysis, Routine w reflex microscopic; Future  Immunization due -     Flu vaccine HIGH DOSE PF(Fluzone Trivalent)  Stage 4 chronic kidney disease (HCC) -     Ambulatory referral to Nephrology     Follow-up: Return in about 3 months (around 09/07/2024).  Debby Molt, MD

## 2024-06-07 NOTE — Patient Instructions (Signed)

## 2024-06-08 ENCOUNTER — Ambulatory Visit: Payer: Self-pay | Admitting: Internal Medicine

## 2024-06-08 DIAGNOSIS — N184 Chronic kidney disease, stage 4 (severe): Secondary | ICD-10-CM | POA: Insufficient documentation

## 2024-06-08 DIAGNOSIS — Z23 Encounter for immunization: Secondary | ICD-10-CM | POA: Insufficient documentation

## 2024-06-08 LAB — URINALYSIS, ROUTINE W REFLEX MICROSCOPIC
Bilirubin Urine: NEGATIVE
Hgb urine dipstick: NEGATIVE
Ketones, ur: NEGATIVE
Leukocytes,Ua: NEGATIVE
Nitrite: NEGATIVE
RBC / HPF: NONE SEEN (ref 0–?)
Specific Gravity, Urine: 1.01 (ref 1.000–1.030)
Urine Glucose: NEGATIVE
Urobilinogen, UA: 0.2 (ref 0.0–1.0)
pH: 7.5 (ref 5.0–8.0)

## 2024-06-09 MED ORDER — COVID-19 MRNA VAC-TRIS(PFIZER) 30 MCG/0.3ML IM SUSY
0.3000 mL | PREFILLED_SYRINGE | Freq: Once | INTRAMUSCULAR | 0 refills | Status: AC
Start: 2024-06-09 — End: 2024-06-09

## 2024-07-05 DIAGNOSIS — N393 Stress incontinence (female) (male): Secondary | ICD-10-CM | POA: Diagnosis not present

## 2024-07-05 DIAGNOSIS — M62838 Other muscle spasm: Secondary | ICD-10-CM | POA: Diagnosis not present

## 2024-07-05 DIAGNOSIS — M6281 Muscle weakness (generalized): Secondary | ICD-10-CM | POA: Diagnosis not present

## 2024-07-26 DIAGNOSIS — N1831 Chronic kidney disease, stage 3a: Secondary | ICD-10-CM | POA: Diagnosis not present

## 2024-08-02 DIAGNOSIS — E46 Unspecified protein-calorie malnutrition: Secondary | ICD-10-CM | POA: Diagnosis not present

## 2024-08-02 DIAGNOSIS — C61 Malignant neoplasm of prostate: Secondary | ICD-10-CM | POA: Diagnosis not present

## 2024-08-02 DIAGNOSIS — N1832 Chronic kidney disease, stage 3b: Secondary | ICD-10-CM | POA: Diagnosis not present

## 2024-08-02 DIAGNOSIS — I129 Hypertensive chronic kidney disease with stage 1 through stage 4 chronic kidney disease, or unspecified chronic kidney disease: Secondary | ICD-10-CM | POA: Diagnosis not present

## 2024-08-02 DIAGNOSIS — E785 Hyperlipidemia, unspecified: Secondary | ICD-10-CM | POA: Diagnosis not present

## 2024-08-06 DIAGNOSIS — C61 Malignant neoplasm of prostate: Secondary | ICD-10-CM | POA: Diagnosis not present

## 2024-08-06 DIAGNOSIS — M62838 Other muscle spasm: Secondary | ICD-10-CM | POA: Diagnosis not present

## 2024-08-06 DIAGNOSIS — M6281 Muscle weakness (generalized): Secondary | ICD-10-CM | POA: Diagnosis not present

## 2024-08-06 DIAGNOSIS — N393 Stress incontinence (female) (male): Secondary | ICD-10-CM | POA: Diagnosis not present

## 2024-08-12 ENCOUNTER — Inpatient Hospital Stay: Admission: RE | Admit: 2024-08-12

## 2024-08-12 DIAGNOSIS — Z122 Encounter for screening for malignant neoplasm of respiratory organs: Secondary | ICD-10-CM

## 2024-08-12 DIAGNOSIS — F17210 Nicotine dependence, cigarettes, uncomplicated: Secondary | ICD-10-CM

## 2024-08-12 DIAGNOSIS — Z87891 Personal history of nicotine dependence: Secondary | ICD-10-CM

## 2024-08-13 DIAGNOSIS — C61 Malignant neoplasm of prostate: Secondary | ICD-10-CM | POA: Diagnosis not present

## 2024-08-13 DIAGNOSIS — N5231 Erectile dysfunction following radical prostatectomy: Secondary | ICD-10-CM | POA: Diagnosis not present

## 2024-08-25 ENCOUNTER — Other Ambulatory Visit: Payer: Self-pay | Admitting: Internal Medicine

## 2024-08-25 DIAGNOSIS — E785 Hyperlipidemia, unspecified: Secondary | ICD-10-CM

## 2024-08-27 ENCOUNTER — Telehealth: Payer: Self-pay | Admitting: Acute Care

## 2024-08-27 DIAGNOSIS — R591 Generalized enlarged lymph nodes: Secondary | ICD-10-CM

## 2024-08-27 NOTE — Telephone Encounter (Signed)
 Please call patient. This scan was read as a LR 2, but there was notation of increase in size of axillary lymph nodes. They are not enlarged by CT Criteria, but have increased in size from 7mm to 10 mm. More on the left. Please ask if he has had any recent vaccines.  Plan is for a CT Chest without contrast in 3 months.I will order and we will have him follow up in the office 1-2 weeks after. Please watch for results as it will help to determine follow up schedule. Thanks so much. Please fax results to PCP and let them know plan of care. Thanks so much

## 2024-08-30 ENCOUNTER — Other Ambulatory Visit: Payer: Self-pay

## 2024-08-30 NOTE — Telephone Encounter (Signed)
 Spoke with patient and advised recommendation is for a 2 month follow up scan instead of a 3 month scan due to vaccines being given 05/2024 not 07/2024. Order placed. Results and plan to PCP.   Dr. Ruthann has an appt with you on 09/07/2024. States he would like to discuss CT results with you prior to scheduling an additional Lung CT.   Isaiah, RN

## 2024-08-30 NOTE — Telephone Encounter (Signed)
 Per Lauraine pt will need a short term follow up scan due to the vaccines being given in October. Please call patient let him know. Please order 8 week follow up scan. Results and plan to PCP.

## 2024-08-30 NOTE — Telephone Encounter (Signed)
 Spoke with pt and reviewed recent lung screening CT results. I asked pt if he had recent immunizations. Pt reports that he had some but couldn't remember what arm they were given in and exactly when they were give. PT states that we should have this information in his chart. Influenza vaccine is noted being given 06/07/24 in left deltoid. Lung screening CT was done 08/12/24 with notation of left axillary lymph nodes increased in size.  I advised pt I would pass this information to Lauraine Lites, NP and we will call him back to let him know how we will proceed with follow up.  Sarah, please advise.

## 2024-08-30 NOTE — Telephone Encounter (Signed)
 Copied from CRM [id number]. Topic: Clinical - Lab/Test Results >> Aug 30, 2024 12:17 PM Devaughn RAMAN wrote: Reason for CRM: Pt calling regarding Lung CT Results, contacted [facility] no answer, please f/u with pt.

## 2024-09-07 ENCOUNTER — Ambulatory Visit: Admitting: Internal Medicine

## 2024-09-07 ENCOUNTER — Ambulatory Visit: Payer: Self-pay | Admitting: Internal Medicine

## 2024-09-07 ENCOUNTER — Encounter: Payer: Self-pay | Admitting: Internal Medicine

## 2024-09-07 VITALS — BP 124/76 | HR 92 | Temp 98.4°F | Ht 67.0 in | Wt 110.8 lb

## 2024-09-07 DIAGNOSIS — D511 Vitamin B12 deficiency anemia due to selective vitamin B12 malabsorption with proteinuria: Secondary | ICD-10-CM | POA: Diagnosis not present

## 2024-09-07 DIAGNOSIS — Z0000 Encounter for general adult medical examination without abnormal findings: Secondary | ICD-10-CM | POA: Diagnosis not present

## 2024-09-07 DIAGNOSIS — Z23 Encounter for immunization: Secondary | ICD-10-CM

## 2024-09-07 DIAGNOSIS — Z0001 Encounter for general adult medical examination with abnormal findings: Secondary | ICD-10-CM

## 2024-09-07 DIAGNOSIS — N184 Chronic kidney disease, stage 4 (severe): Secondary | ICD-10-CM | POA: Insufficient documentation

## 2024-09-07 DIAGNOSIS — Z8546 Personal history of malignant neoplasm of prostate: Secondary | ICD-10-CM | POA: Diagnosis not present

## 2024-09-07 DIAGNOSIS — I10 Essential (primary) hypertension: Secondary | ICD-10-CM

## 2024-09-07 DIAGNOSIS — N1832 Chronic kidney disease, stage 3b: Secondary | ICD-10-CM | POA: Diagnosis not present

## 2024-09-07 DIAGNOSIS — D531 Other megaloblastic anemias, not elsewhere classified: Secondary | ICD-10-CM | POA: Diagnosis not present

## 2024-09-07 DIAGNOSIS — C61 Malignant neoplasm of prostate: Secondary | ICD-10-CM

## 2024-09-07 DIAGNOSIS — I130 Hypertensive heart and chronic kidney disease with heart failure and stage 1 through stage 4 chronic kidney disease, or unspecified chronic kidney disease: Secondary | ICD-10-CM | POA: Insufficient documentation

## 2024-09-07 LAB — CBC WITH DIFFERENTIAL/PLATELET
Basophils Absolute: 0 K/uL (ref 0.0–0.1)
Basophils Relative: 0.7 % (ref 0.0–3.0)
Eosinophils Absolute: 0.2 K/uL (ref 0.0–0.7)
Eosinophils Relative: 2.9 % (ref 0.0–5.0)
HCT: 39 % (ref 39.0–52.0)
Hemoglobin: 12.8 g/dL — ABNORMAL LOW (ref 13.0–17.0)
Lymphocytes Relative: 37.9 % (ref 12.0–46.0)
Lymphs Abs: 2.6 K/uL (ref 0.7–4.0)
MCHC: 32.9 g/dL (ref 30.0–36.0)
MCV: 89.2 fl (ref 78.0–100.0)
Monocytes Absolute: 0.8 K/uL (ref 0.1–1.0)
Monocytes Relative: 12.5 % — ABNORMAL HIGH (ref 3.0–12.0)
Neutro Abs: 3.1 K/uL (ref 1.4–7.7)
Neutrophils Relative %: 46 % (ref 43.0–77.0)
Platelets: 285 K/uL (ref 150.0–400.0)
RBC: 4.37 Mil/uL (ref 4.22–5.81)
RDW: 17.2 % — ABNORMAL HIGH (ref 11.5–15.5)
WBC: 6.8 K/uL (ref 4.0–10.5)

## 2024-09-07 LAB — BASIC METABOLIC PANEL WITH GFR
BUN: 10 mg/dL (ref 6–23)
CO2: 29 meq/L (ref 19–32)
Calcium: 9.8 mg/dL (ref 8.4–10.5)
Chloride: 97 meq/L (ref 96–112)
Creatinine, Ser: 1.77 mg/dL — ABNORMAL HIGH (ref 0.40–1.50)
GFR: 39.55 mL/min — ABNORMAL LOW
Glucose, Bld: 96 mg/dL (ref 70–99)
Potassium: 4 meq/L (ref 3.5–5.1)
Sodium: 136 meq/L (ref 135–145)

## 2024-09-07 MED ORDER — CYANOCOBALAMIN 1000 MCG/ML IJ SOLN
1000.0000 ug | Freq: Once | INTRAMUSCULAR | Status: AC
  Administered 2024-09-07: 1000 ug via INTRAMUSCULAR

## 2024-09-07 MED ORDER — CYANOCOBALAMIN 2000 MCG PO TABS: 2000.0000 ug | ORAL_TABLET | Freq: Every day | ORAL | 0 refills | Status: AC

## 2024-09-07 NOTE — Progress Notes (Signed)
 "  Subjective:  Patient ID: George Austin, male    DOB: [birth year]  Age: 69 y.o. MRN: [id number]  CC: Hypertension (3 month follow up ) and Annual Exam   HPI George Austin presents for a CPX and f/up   Discussed the use of AI scribe software for clinical note transcription with the patient, who gave verbal consent to proceed.  History of Present Illness George Austin is a 69 year old male with prostate cancer who presents for follow-up after a recent urology visit and chest scan.  During a recent CT scan, he was told that bumps were found under his arms, but he is not aware of any lymphadenopathy. No coughing, wheezing, chest pain, or shortness of breath. He underwent prostate surgery, and he recalls that his last PSA was reported to be 0.00.  He denies any issues with urination and has not noticed any side effects from his medications. No muscle or joint aches, and no trouble with bowel movements.  He has not completely quit smoking or drinking but has reduced both. He has not received a COVID vaccine recently due to a previous adverse reaction and is unsure about his pneumonia vaccination status.     Outpatient Medications Prior to Visit  Medication Sig Dispense Refill   amLODipine  (NORVASC ) 5 MG tablet Take 5 mg by mouth daily.     azelastine  (ASTELIN ) 0.1 % nasal spray Place 1 spray into both nostrils 2 (two) times daily. Use in each nostril as directed (Patient taking differently: Place 1 spray into both nostrils 2 (two) times daily as needed for rhinitis. Use in each nostril as directed) 90 mL 1   Cholecalciferol (VITAMIN D3 MAXIMUM STRENGTH) 125 MCG (5000 UT) capsule Take 5,000 Units by mouth every other day.     docusate sodium  (COLACE) 100 MG capsule Take 1 capsule (100 mg total) by mouth 2 (two) times daily.     folic acid  (FOLVITE ) 1 MG tablet TAKE 1 TABLET BY MOUTH EVERY DAY 90 tablet 1   ketoconazole  (NIZORAL ) 2 % shampoo Apply 1 Application topically 2 (two) times a  week. 120 mL 0   magnesium gluconate (MAGONATE) 500 (27 Mg) MG TABS tablet Take 500 mg by mouth daily.     Multiple Vitamin (MULTIVITAMIN WITH MINERALS) TABS tablet Take 1 tablet by mouth daily.     olmesartan (BENICAR) 5 MG tablet Take 5 mg by mouth daily.     polyethylene glycol (MIRALAX ) 17 g packet Take 17 g by mouth daily. 14 each 0   rosuvastatin  (CRESTOR ) 10 MG tablet TAKE 1 TABLET BY MOUTH EVERY DAY 90 tablet 0   tadalafil (CIALIS) 20 MG tablet Take 20 mg by mouth daily as needed.     thiamine  (VITAMIN B-1) 50 MG tablet Take 1 tablet (50 mg total) by mouth daily. 90 tablet 1   No facility-administered medications prior to visit.    ROS Review of Systems  Constitutional: Negative.  Negative for appetite change, chills, diaphoresis, fatigue and fever.  HENT: Negative.    Eyes: Negative.  Negative for visual disturbance.  Respiratory:  Negative for cough, chest tightness, shortness of breath and wheezing.   Cardiovascular:  Negative for chest pain, palpitations and leg swelling.  Gastrointestinal: Negative.  Negative for abdominal pain, constipation, diarrhea, nausea and vomiting.  Endocrine: Negative.   Genitourinary: Negative.  Negative for difficulty urinating.  Musculoskeletal: Negative.   Skin: Negative.   Neurological:  Negative for dizziness, weakness, light-headedness and numbness.  Hematological:  Negative for adenopathy. Does not bruise/bleed easily.  Psychiatric/Behavioral:  Positive for confusion and decreased concentration. Negative for sleep disturbance. The patient is not nervous/anxious.     Objective:  BP 124/76 (BP Location: Left Arm, Patient Position: Sitting, Cuff Size: Normal)   Pulse 92   Temp 98.4 F (36.9 C) (Oral)   Ht 5' 7 (1.702 m)   Wt 110 lb 12.8 oz (50.3 kg)   SpO2 97%   BMI 17.35 kg/m   BP Readings from Last 3 Encounters:  09/07/24 124/76  06/07/24 118/60  03/19/24 123/68    Wt Readings from Last 3 Encounters:  09/07/24 110 lb 12.8  oz (50.3 kg)  06/07/24 106 lb 12.8 oz (48.4 kg)  03/29/24 115 lb (52.2 kg)    Physical Exam Vitals reviewed.  Constitutional:      General: He is not in acute distress.    Appearance: He is underweight. He is ill-appearing. He is not toxic-appearing or diaphoretic.  HENT:     Nose: Nose normal.     Mouth/Throat:     Mouth: Mucous membranes are moist.  Eyes:     General: No scleral icterus.    Conjunctiva/sclera: Conjunctivae normal.  Cardiovascular:     Rate and Rhythm: Normal rate and regular rhythm.     Heart sounds: No murmur heard.    No friction rub. No gallop.  Pulmonary:     Effort: Pulmonary effort is normal.     Breath sounds: No stridor. No wheezing, rhonchi or rales.  Abdominal:     General: Abdomen is flat.     Palpations: There is no mass.     Tenderness: There is no abdominal tenderness. There is no guarding.     Hernia: No hernia is present.  Musculoskeletal:        General: No swelling. Normal range of motion.     Cervical back: Neck supple.     Right lower leg: No edema.     Left lower leg: No edema.  Lymphadenopathy:     Head:     Right side of head: No submental, submandibular, tonsillar, preauricular, posterior auricular or occipital adenopathy.     Left side of head: No submental, submandibular, tonsillar, preauricular, posterior auricular or occipital adenopathy.     Cervical: No cervical adenopathy.     Right cervical: No superficial, deep or posterior cervical adenopathy.    Upper Body:     Right upper body: No supraclavicular or axillary adenopathy.     Left upper body: No supraclavicular or axillary adenopathy.     Lower Body: No right inguinal adenopathy. No left inguinal adenopathy.  Skin:    General: Skin is warm and dry.     Findings: No rash.  Neurological:     Mental Status: He is alert. Mental status is at baseline.     Cranial Nerves: Cranial nerves 2-12 are intact.     Motor: Atrophy present.     Gait: Gait is intact. Gait normal.      Deep Tendon Reflexes: Reflexes normal.  Psychiatric:        Mood and Affect: Mood normal.        Behavior: Behavior normal.     Lab Results  Component Value Date   WBC 6.8 09/07/2024   HGB 12.8 (L) 09/07/2024   HCT 39.0 09/07/2024   PLT 285.0 09/07/2024   GLUCOSE 96 09/07/2024   CHOL 163 06/07/2024   TRIG 94.0 06/07/2024   HDL 66.60 06/07/2024  LDLCALC 78 06/07/2024   ALT 14 06/07/2024   AST 23 06/07/2024   NA 136 09/07/2024   K 4.0 09/07/2024   CL 97 09/07/2024   CREATININE 1.77 (H) 09/07/2024   BUN 10 09/07/2024   CO2 29 09/07/2024   TSH 0.90 06/07/2024   PSA 0.0 05/10/2024   INR 0.9 06/07/2024   HGBA1C 5.0 07/28/2019    CT CHEST LUNG CA SCREEN LOW DOSE W/O CM Result Date: 08/23/2024 CLINICAL DATA:  Twenty pack-year current smoker. EXAM: CT CHEST WITHOUT CONTRAST LOW-DOSE FOR LUNG CANCER SCREENING TECHNIQUE: Multidetector CT imaging of the chest was performed following the standard protocol without IV contrast. RADIATION DOSE REDUCTION: This exam was performed according to the departmental dose-optimization program which includes automated exposure control, adjustment of the mA and/or kV according to patient size and/or use of iterative reconstruction technique. COMPARISON:  08/12/2023. FINDINGS: Cardiovascular: Atherosclerotic calcification of the aorta. Heart size normal. No pericardial effusion. Mediastinum/Nodes: No pathologically enlarged mediastinal lymph nodes. Hilar regions are difficult to definitively evaluate without IV contrast. Axillary lymph nodes are not enlarged by CT size criteria but are more prominent on the left, measuring up to 10 mm previously 7 mm, nonspecific. Esophagus is grossly unremarkable. Lungs/Pleura: Biapical pleuroparenchymal scarring. Centrilobular and paraseptal emphysema. Smoking related respiratory bronchiolitis. Mild cylindrical bronchiectasis. Tiny juxtapleural nodules are considered benign. No new or suspicious pulmonary nodules. No  pleural fluid. Airway is unremarkable. Upper Abdomen: Small low-attenuation lesion in the right kidney. No specific follow-up necessary. Splenectomy. Left Bochdalek hernia contains fat. Visualized portions of the liver, gallbladder, adrenal glands, kidneys, pancreas, stomach and bowel are otherwise grossly unremarkable. No upper abdominal adenopathy. Musculoskeletal: None. IMPRESSION: 1. Lung-RADS 2, benign appearance or behavior. Continue annual screening with low-dose chest CT without contrast in 12 months. 2. Axillary lymph nodes are not enlarged by CT size criteria but are more prominent on the left. Consider short-term follow-up CT chest contrast in 3 months, as clinically indicated. 3. Mild cylindrical bronchiectasis. 4.  Aortic atherosclerosis (ICD10-I70.0). 5.  Emphysema (ICD10-J43.9). Electronically Signed   By: Newell Eke M.D.   On: 08/23/2024 09:16    Estimated Creatinine Clearance: 29.2 mL/min (A) (by C-G formula based on SCr of 1.77 mg/dL (H)).   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 -     CBC with Differential/Platelet; Future -     Cyanocobalamin ; Take 1 tablet (2,000 mcg total) by mouth daily.  Dispense: 90 tablet; Refill: 0 -     Cyanocobalamin   Combined B12 and folate deficiency anemia -     CBC with Differential/Platelet; Future -     Cyanocobalamin ; Take 1 tablet (2,000 mcg total) by mouth daily.  Dispense: 90 tablet; Refill: 0  Stage 3b chronic kidney disease (HCC)- Will avoid nephrotoxic agents  -     Basic metabolic panel with GFR; Future  Primary hypertension- BP is well controlled. -     Basic metabolic panel with GFR; Future  Encounter for general adult medical examination with abnormal findings- Exam completed, labs reviewed, vaccines reviewed and updated, cancer screenings are UTD, pt ed material was given.   Immunization due -     Pneumococcal polysaccharide vaccine 23-valent greater than or  equal to 2yo subcutaneous/IM     Follow-up: Return in about 6 months (around 03/07/2025).  Debby Molt, MD "

## 2024-09-07 NOTE — Patient Instructions (Signed)
 Health Maintenance, Male  Adopting a healthy lifestyle and getting preventive care are important in promoting health and wellness. Ask your health care provider about:  The right schedule for you to have regular tests and exams.  Things you can do on your own to prevent diseases and keep yourself healthy.  What should I know about diet, weight, and exercise?  Eat a healthy diet    Eat a diet that includes plenty of vegetables, fruits, low-fat dairy products, and lean protein.  Do not eat a lot of foods that are high in solid fats, added sugars, or sodium.  Maintain a healthy weight  Body mass index (BMI) is a measurement that can be used to identify possible weight problems. It estimates body fat based on height and weight. Your health care provider can help determine your BMI and help you achieve or maintain a healthy weight.  Get regular exercise  Get regular exercise. This is one of the most important things you can do for your health. Most adults should:  Exercise for at least 150 minutes each week. The exercise should increase your heart rate and make you sweat (moderate-intensity exercise).  Do strengthening exercises at least twice a week. This is in addition to the moderate-intensity exercise.  Spend less time sitting. Even light physical activity can be beneficial.  Watch cholesterol and blood lipids  Have your blood tested for lipids and cholesterol at 67 years of age, then have this test every 5 years.  You may need to have your cholesterol levels checked more often if:  Your lipid or cholesterol levels are high.  You are older than 67 years of age.  You are at high risk for heart disease.  What should I know about cancer screening?  Many types of cancers can be detected early and may often be prevented. Depending on your health history and family history, you may need to have cancer screening at various ages. This may include screening for:  Colorectal cancer.  Prostate cancer.  Skin cancer.  Lung  cancer.  What should I know about heart disease, diabetes, and high blood pressure?  Blood pressure and heart disease  High blood pressure causes heart disease and increases the risk of stroke. This is more likely to develop in people who have high blood pressure readings or are overweight.  Talk with your health care provider about your target blood pressure readings.  Have your blood pressure checked:  Every 3-5 years if you are 24-52 years of age.  Every year if you are 3 years old or older.  If you are between the ages of 60 and 72 and are a current or former smoker, ask your health care provider if you should have a one-time screening for abdominal aortic aneurysm (AAA).  Diabetes  Have regular diabetes screenings. This checks your fasting blood sugar level. Have the screening done:  Once every three years after age 66 if you are at a normal weight and have a low risk for diabetes.  More often and at a younger age if you are overweight or have a high risk for diabetes.  What should I know about preventing infection?  Hepatitis B  If you have a higher risk for hepatitis B, you should be screened for this virus. Talk with your health care provider to find out if you are at risk for hepatitis B infection.  Hepatitis C  Blood testing is recommended for:  Everyone born from [birth year] through 1963.  Anyone  with known risk factors for hepatitis C.  Sexually transmitted infections (STIs)  You should be screened each year for STIs, including gonorrhea and chlamydia, if:  You are sexually active and are younger than 67 years of age.  You are older than 67 years of age and your health care provider tells you that you are at risk for this type of infection.  Your sexual activity has changed since you were last screened, and you are at increased risk for chlamydia or gonorrhea. Ask your health care provider if you are at risk.  Ask your health care provider about whether you are at high risk for HIV. Your health care provider  may recommend a prescription medicine to help prevent HIV infection. If you choose to take medicine to prevent HIV, you should first get tested for HIV. You should then be tested every 3 months for as long as you are taking the medicine.  Follow these instructions at home:  Alcohol use  Do not drink alcohol if your health care provider tells you not to drink.  If you drink alcohol:  Limit how much you have to 0-2 drinks a day.  Know how much alcohol is in your drink. In the [place] one drink equals one 12 oz bottle of beer (355 mL), one 5 oz glass of wine (148 mL), or one 1 oz glass of hard liquor (44 mL).  Lifestyle  Do not use any products that contain nicotine or tobacco. These products include cigarettes, chewing tobacco, and vaping devices, such as e-cigarettes. If you need help quitting, ask your health care provider.  Do not use street drugs.  Do not share needles.  Ask your health care provider for help if you need support or information about quitting drugs.  General instructions  Schedule regular health, dental, and eye exams.  Stay current with your vaccines.  Tell your health care provider if:  You often feel depressed.  You have ever been abused or do not feel safe at home.  Summary  Adopting a healthy lifestyle and getting preventive care are important in promoting health and wellness.  Follow your health care provider's instructions about healthy diet, exercising, and getting tested or screened for diseases.  Follow your health care provider's instructions on monitoring your cholesterol and blood pressure.  This information is not intended to replace advice given to you by your health care provider. Make sure you discuss any questions you have with your health care provider.  Document Revised: 01/01/2021 Document Reviewed: 01/01/2021  Elsevier Patient Education  2024 [employer].

## 2024-09-09 NOTE — Telephone Encounter (Signed)
 Pt did see Dr. Joshua on 09/07/2024 and CT was discussed. Called patient for update. Order for follow up scan was placed and reminder was set for 09/2024 as scan is due 10/14/2024. If patient does not respond we will follow up again closer to due date.   AP

## 2024-09-10 NOTE — Telephone Encounter (Signed)
 George Austin,   I have spoken with the patient and scheduled his 2 month follow up CT on 10/13/2024 at [facility]. Pt states he had additional injections earlier this week with his PCP. States he was given Vit B12 and a pneumonia vaccine. Attempted to schedule OV ASAP for a consult, per secure chat request, pt wanted to know why he needed to come in. 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 Pt hesitant to schedule an OV and wanted more information as to why he needed to come to the office at this time. Please advise.   Isaiah, RN

## 2024-09-14 NOTE — Telephone Encounter (Signed)
 Reminder has been set to outreach patient closer to scan to schedule follow up appt. Per patient original request to make an appt was for the scan, not OV.

## 2024-10-13 ENCOUNTER — Other Ambulatory Visit

## 2025-03-07 ENCOUNTER — Ambulatory Visit: Admitting: Internal Medicine

## 2025-03-30 ENCOUNTER — Ambulatory Visit
